# Patient Record
Sex: Female | Born: 1965
Health system: Southern US, Community
[De-identification: ages and names within clinical notes are randomized; demographics above are authoritative.]

## PROBLEM LIST (undated history)

## (undated) DIAGNOSIS — L121 Cicatricial pemphigoid: Secondary | ICD-10-CM

## (undated) DIAGNOSIS — K602 Anal fissure, unspecified: Secondary | ICD-10-CM

## (undated) DIAGNOSIS — K5790 Diverticulosis of intestine, part unspecified, without perforation or abscess without bleeding: Secondary | ICD-10-CM

## (undated) DIAGNOSIS — E785 Hyperlipidemia, unspecified: Secondary | ICD-10-CM

## (undated) DIAGNOSIS — R7989 Other specified abnormal findings of blood chemistry: Secondary | ICD-10-CM

## (undated) DIAGNOSIS — D126 Benign neoplasm of colon, unspecified: Secondary | ICD-10-CM

## (undated) DIAGNOSIS — M1990 Unspecified osteoarthritis, unspecified site: Secondary | ICD-10-CM

## (undated) DIAGNOSIS — I7300 Raynaud's syndrome without gangrene: Secondary | ICD-10-CM

## (undated) DIAGNOSIS — K648 Other hemorrhoids: Secondary | ICD-10-CM

## (undated) DIAGNOSIS — M810 Age-related osteoporosis without current pathological fracture: Secondary | ICD-10-CM

## (undated) DIAGNOSIS — I10 Essential (primary) hypertension: Secondary | ICD-10-CM

## (undated) HISTORY — DX: Hyperlipidemia, unspecified: E78.5

## (undated) HISTORY — DX: Essential (primary) hypertension: I10

## (undated) HISTORY — DX: Other specified abnormal findings of blood chemistry: R79.89

## (undated) HISTORY — DX: Other hemorrhoids: K64.8

## (undated) HISTORY — DX: Anal fissure, unspecified: K60.2

## (undated) HISTORY — PX: OTHER SURGICAL HISTORY: SHX169

## (undated) HISTORY — DX: Unspecified osteoarthritis, unspecified site: M19.90

## (undated) HISTORY — DX: Raynaud's syndrome without gangrene: I73.00

## (undated) HISTORY — DX: Age-related osteoporosis without current pathological fracture: M81.0

## (undated) HISTORY — DX: Cicatricial pemphigoid: L12.1

## (undated) HISTORY — PX: COLONOSCOPY: SHX174

## (undated) HISTORY — DX: Benign neoplasm of colon, unspecified: D12.6

## (undated) HISTORY — DX: Diverticulosis of intestine, part unspecified, without perforation or abscess without bleeding: K57.90

---

## 2016-10-13 ENCOUNTER — Ambulatory Visit
Admission: RE | Admit: 2016-10-13 | Discharge: 2016-10-13 | Disposition: A | Discharge status: Home / Self Care | Payer: BLUE CROSS/BLUE SHIELD | Source: Ambulatory Visit | Attending: Family Medicine | Admitting: Family Medicine

## 2016-10-13 ENCOUNTER — Other Ambulatory Visit: Payer: Self-pay | Admitting: Family Medicine

## 2016-10-13 DIAGNOSIS — M1712 Unilateral primary osteoarthritis, left knee: Secondary | ICD-10-CM

## 2017-06-13 DIAGNOSIS — R238 Other skin changes: Secondary | ICD-10-CM | POA: Diagnosis not present

## 2017-06-13 DIAGNOSIS — Z23 Encounter for immunization: Secondary | ICD-10-CM | POA: Diagnosis not present

## 2017-06-13 DIAGNOSIS — Z6822 Body mass index (BMI) 22.0-22.9, adult: Secondary | ICD-10-CM | POA: Diagnosis not present

## 2017-06-13 DIAGNOSIS — L299 Pruritus, unspecified: Secondary | ICD-10-CM | POA: Diagnosis not present

## 2017-07-06 DIAGNOSIS — Z1231 Encounter for screening mammogram for malignant neoplasm of breast: Secondary | ICD-10-CM | POA: Diagnosis not present

## 2017-08-10 DIAGNOSIS — R899 Unspecified abnormal finding in specimens from other organs, systems and tissues: Secondary | ICD-10-CM | POA: Diagnosis not present

## 2017-08-10 DIAGNOSIS — M19049 Primary osteoarthritis, unspecified hand: Secondary | ICD-10-CM | POA: Diagnosis not present

## 2017-08-10 DIAGNOSIS — M778 Other enthesopathies, not elsewhere classified: Secondary | ICD-10-CM | POA: Diagnosis not present

## 2017-08-10 DIAGNOSIS — Z6821 Body mass index (BMI) 21.0-21.9, adult: Secondary | ICD-10-CM | POA: Diagnosis not present

## 2018-04-02 DIAGNOSIS — Z13220 Encounter for screening for lipoid disorders: Secondary | ICD-10-CM | POA: Diagnosis not present

## 2018-04-02 DIAGNOSIS — Z1329 Encounter for screening for other suspected endocrine disorder: Secondary | ICD-10-CM | POA: Diagnosis not present

## 2018-04-02 DIAGNOSIS — Z0000 Encounter for general adult medical examination without abnormal findings: Secondary | ICD-10-CM | POA: Diagnosis not present

## 2018-04-02 DIAGNOSIS — E559 Vitamin D deficiency, unspecified: Secondary | ICD-10-CM | POA: Diagnosis not present

## 2018-04-02 DIAGNOSIS — Z114 Encounter for screening for human immunodeficiency virus [HIV]: Secondary | ICD-10-CM | POA: Diagnosis not present

## 2018-04-05 DIAGNOSIS — Z1211 Encounter for screening for malignant neoplasm of colon: Secondary | ICD-10-CM | POA: Diagnosis not present

## 2018-04-05 DIAGNOSIS — Z6821 Body mass index (BMI) 21.0-21.9, adult: Secondary | ICD-10-CM | POA: Diagnosis not present

## 2018-04-05 DIAGNOSIS — M2550 Pain in unspecified joint: Secondary | ICD-10-CM | POA: Diagnosis not present

## 2018-04-05 DIAGNOSIS — M1990 Unspecified osteoarthritis, unspecified site: Secondary | ICD-10-CM | POA: Diagnosis not present

## 2018-04-05 DIAGNOSIS — M8580 Other specified disorders of bone density and structure, unspecified site: Secondary | ICD-10-CM | POA: Diagnosis not present

## 2018-04-05 DIAGNOSIS — K602 Anal fissure, unspecified: Secondary | ICD-10-CM | POA: Diagnosis not present

## 2018-04-05 DIAGNOSIS — M1711 Unilateral primary osteoarthritis, right knee: Secondary | ICD-10-CM | POA: Diagnosis not present

## 2018-04-05 DIAGNOSIS — Z0000 Encounter for general adult medical examination without abnormal findings: Secondary | ICD-10-CM | POA: Diagnosis not present

## 2018-04-05 DIAGNOSIS — Z23 Encounter for immunization: Secondary | ICD-10-CM | POA: Diagnosis not present

## 2018-04-23 DIAGNOSIS — Z6821 Body mass index (BMI) 21.0-21.9, adult: Secondary | ICD-10-CM | POA: Diagnosis not present

## 2018-04-23 DIAGNOSIS — M2550 Pain in unspecified joint: Secondary | ICD-10-CM | POA: Diagnosis not present

## 2018-04-23 DIAGNOSIS — R768 Other specified abnormal immunological findings in serum: Secondary | ICD-10-CM | POA: Diagnosis not present

## 2018-04-23 DIAGNOSIS — K602 Anal fissure, unspecified: Secondary | ICD-10-CM | POA: Diagnosis not present

## 2018-05-15 DIAGNOSIS — R768 Other specified abnormal immunological findings in serum: Secondary | ICD-10-CM | POA: Diagnosis not present

## 2018-05-15 DIAGNOSIS — M2550 Pain in unspecified joint: Secondary | ICD-10-CM | POA: Diagnosis not present

## 2018-05-21 DIAGNOSIS — Z23 Encounter for immunization: Secondary | ICD-10-CM | POA: Diagnosis not present

## 2018-05-21 DIAGNOSIS — K649 Unspecified hemorrhoids: Secondary | ICD-10-CM | POA: Diagnosis not present

## 2018-05-21 DIAGNOSIS — K602 Anal fissure, unspecified: Secondary | ICD-10-CM | POA: Diagnosis not present

## 2018-05-21 DIAGNOSIS — K5900 Constipation, unspecified: Secondary | ICD-10-CM | POA: Diagnosis not present

## 2018-05-21 DIAGNOSIS — M2550 Pain in unspecified joint: Secondary | ICD-10-CM | POA: Diagnosis not present

## 2018-09-28 DIAGNOSIS — E559 Vitamin D deficiency, unspecified: Secondary | ICD-10-CM | POA: Diagnosis not present

## 2018-10-04 DIAGNOSIS — E559 Vitamin D deficiency, unspecified: Secondary | ICD-10-CM | POA: Diagnosis not present

## 2018-10-04 DIAGNOSIS — K5900 Constipation, unspecified: Secondary | ICD-10-CM | POA: Diagnosis not present

## 2018-10-04 DIAGNOSIS — K602 Anal fissure, unspecified: Secondary | ICD-10-CM | POA: Diagnosis not present

## 2018-10-04 DIAGNOSIS — Z6821 Body mass index (BMI) 21.0-21.9, adult: Secondary | ICD-10-CM | POA: Diagnosis not present

## 2018-12-29 IMAGING — CR DG KNEE 1-2V*R*
2 series · 2 of 2 positions shown · non-contrast
Comparison: None.

CLINICAL DATA: Chronic pain for approximately 3 months

EXAM:
RIGHT KNEE - 1-2 VIEW

[w knee ap right]
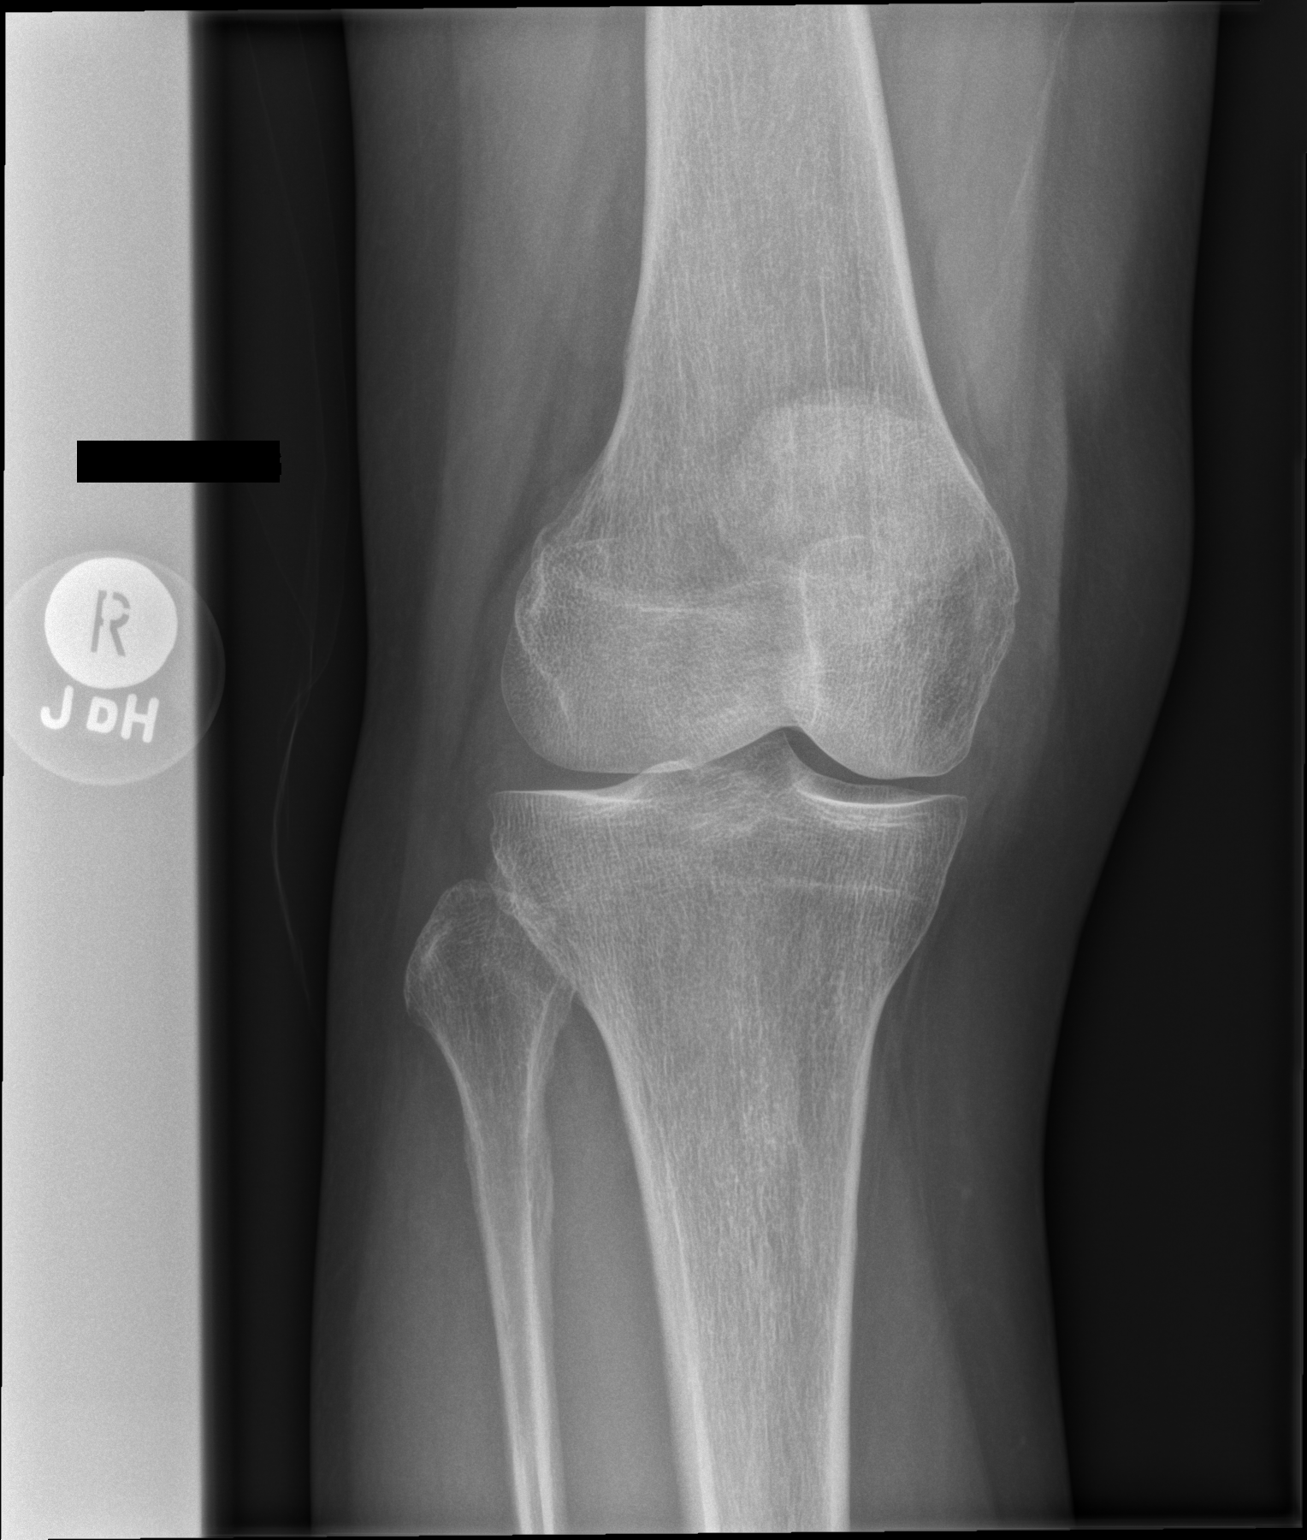

[w knee lat right]
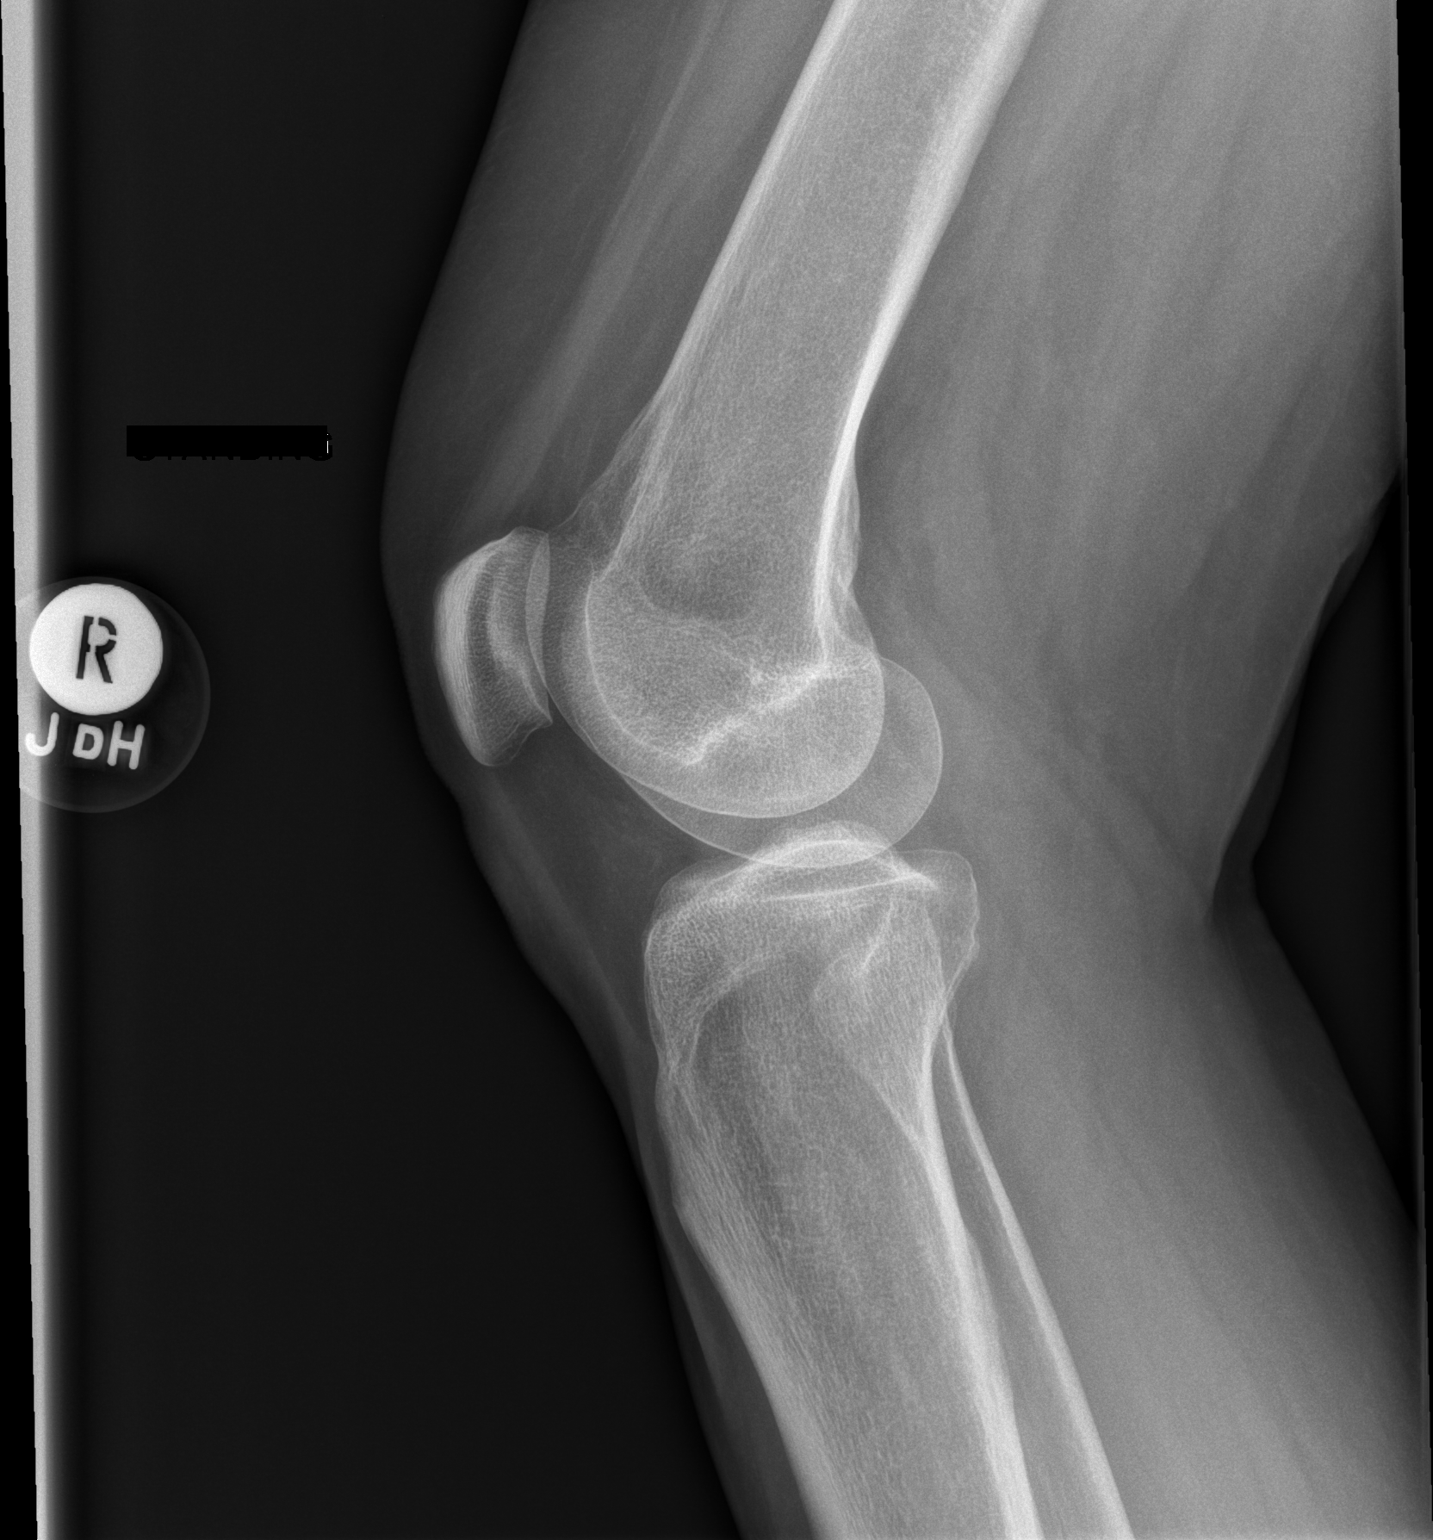

[2 of 2 positions shown; findings below may reference images not displayed]

FINDINGS: Standing frontal and standing lateral views obtained. No evident
fracture or dislocation. No joint effusion. Joint spaces appear
unremarkable. No erosive change.
IMPRESSION: No fracture or joint effusion.  No appreciable arthropathic change.

## 2019-06-13 DIAGNOSIS — Z23 Encounter for immunization: Secondary | ICD-10-CM | POA: Diagnosis not present

## 2019-10-24 DIAGNOSIS — Z1231 Encounter for screening mammogram for malignant neoplasm of breast: Secondary | ICD-10-CM | POA: Diagnosis not present

## 2019-10-24 DIAGNOSIS — Z1151 Encounter for screening for human papillomavirus (HPV): Secondary | ICD-10-CM | POA: Diagnosis not present

## 2019-10-24 DIAGNOSIS — Z01419 Encounter for gynecological examination (general) (routine) without abnormal findings: Secondary | ICD-10-CM | POA: Diagnosis not present

## 2019-10-24 DIAGNOSIS — Z6820 Body mass index (BMI) 20.0-20.9, adult: Secondary | ICD-10-CM | POA: Diagnosis not present

## 2019-10-29 ENCOUNTER — Other Ambulatory Visit: Payer: Self-pay | Admitting: Obstetrics and Gynecology

## 2019-10-29 DIAGNOSIS — Z780 Asymptomatic menopausal state: Secondary | ICD-10-CM

## 2019-10-29 DIAGNOSIS — Z8739 Personal history of other diseases of the musculoskeletal system and connective tissue: Secondary | ICD-10-CM

## 2019-11-05 ENCOUNTER — Other Ambulatory Visit: Payer: Self-pay

## 2019-11-05 ENCOUNTER — Ambulatory Visit
Admission: RE | Admit: 2019-11-05 | Discharge: 2019-11-05 | Disposition: A | Discharge status: Home / Self Care | Payer: BC Managed Care – PPO | Source: Ambulatory Visit | Attending: Obstetrics and Gynecology | Admitting: Obstetrics and Gynecology

## 2019-11-05 DIAGNOSIS — M810 Age-related osteoporosis without current pathological fracture: Secondary | ICD-10-CM | POA: Diagnosis not present

## 2019-11-05 DIAGNOSIS — Z780 Asymptomatic menopausal state: Secondary | ICD-10-CM

## 2019-11-05 DIAGNOSIS — M8588 Other specified disorders of bone density and structure, other site: Secondary | ICD-10-CM | POA: Diagnosis not present

## 2019-11-05 DIAGNOSIS — Z8739 Personal history of other diseases of the musculoskeletal system and connective tissue: Secondary | ICD-10-CM

## 2019-11-13 ENCOUNTER — Ambulatory Visit: Payer: BLUE CROSS/BLUE SHIELD | Admitting: Obstetrics and Gynecology

## 2019-11-15 DIAGNOSIS — M810 Age-related osteoporosis without current pathological fracture: Secondary | ICD-10-CM | POA: Diagnosis not present

## 2019-12-04 DIAGNOSIS — M810 Age-related osteoporosis without current pathological fracture: Secondary | ICD-10-CM | POA: Diagnosis not present

## 2019-12-04 DIAGNOSIS — E559 Vitamin D deficiency, unspecified: Secondary | ICD-10-CM | POA: Diagnosis not present

## 2019-12-04 DIAGNOSIS — H9319 Tinnitus, unspecified ear: Secondary | ICD-10-CM | POA: Diagnosis not present

## 2019-12-12 DIAGNOSIS — E7800 Pure hypercholesterolemia, unspecified: Secondary | ICD-10-CM | POA: Diagnosis not present

## 2019-12-12 DIAGNOSIS — E559 Vitamin D deficiency, unspecified: Secondary | ICD-10-CM | POA: Diagnosis not present

## 2019-12-12 DIAGNOSIS — Z0184 Encounter for antibody response examination: Secondary | ICD-10-CM | POA: Diagnosis not present

## 2019-12-12 DIAGNOSIS — Z0000 Encounter for general adult medical examination without abnormal findings: Secondary | ICD-10-CM | POA: Diagnosis not present

## 2019-12-19 DIAGNOSIS — Z0000 Encounter for general adult medical examination without abnormal findings: Secondary | ICD-10-CM | POA: Diagnosis not present

## 2019-12-19 DIAGNOSIS — H9319 Tinnitus, unspecified ear: Secondary | ICD-10-CM | POA: Diagnosis not present

## 2019-12-19 DIAGNOSIS — B354 Tinea corporis: Secondary | ICD-10-CM | POA: Diagnosis not present

## 2019-12-19 DIAGNOSIS — Z6820 Body mass index (BMI) 20.0-20.9, adult: Secondary | ICD-10-CM | POA: Diagnosis not present

## 2019-12-19 DIAGNOSIS — Z1211 Encounter for screening for malignant neoplasm of colon: Secondary | ICD-10-CM | POA: Diagnosis not present

## 2020-01-15 DIAGNOSIS — H903 Sensorineural hearing loss, bilateral: Secondary | ICD-10-CM | POA: Diagnosis not present

## 2020-02-03 ENCOUNTER — Ambulatory Visit (INDEPENDENT_AMBULATORY_CARE_PROVIDER_SITE_OTHER): Payer: BC Managed Care – PPO | Admitting: Otolaryngology

## 2020-02-03 ENCOUNTER — Encounter (INDEPENDENT_AMBULATORY_CARE_PROVIDER_SITE_OTHER): Payer: Self-pay | Admitting: Otolaryngology

## 2020-02-03 ENCOUNTER — Other Ambulatory Visit: Payer: Self-pay

## 2020-02-03 VITALS — Temp 98.2°F

## 2020-02-03 DIAGNOSIS — H9313 Tinnitus, bilateral: Secondary | ICD-10-CM | POA: Diagnosis not present

## 2020-02-03 DIAGNOSIS — H6121 Impacted cerumen, right ear: Secondary | ICD-10-CM | POA: Diagnosis not present

## 2020-02-03 DIAGNOSIS — K1230 Oral mucositis (ulcerative), unspecified: Secondary | ICD-10-CM

## 2020-02-03 NOTE — Progress Notes (Signed)
HPI: Michelle Cunningham is a 54 y.o. female who presents is referred by her periodontist for evaluation of redness on her alveolar mucosa on the inner side of the upper molars in the front portion of the incisors up top.  She had a biopsy performed that demonstrated only ulcerative mucositis and no evidence of malignancy.  She has had this problem for several months and they felt it might be lichen planus.  She was referred to be seen at [facility] dentistry for further evaluation and possible rebiopsy. She also has ringing in her ears and brings a hearing test from [facility] which demonstrated a mild bilateral symmetric upper frequency sensorineural hearing loss above 4000 frequency.  She had type A tympanograms bilaterally.  No past medical history on file.  Social History   Socioeconomic History  . Marital status: Married    Spouse name: Not on file  . Number of children: Not on file  . Years of education: Not on file  . Highest education level: Not on file  Occupational History  . Not on file  Tobacco Use  . Smoking status: Never Smoker  . Smokeless tobacco: Never Used  Substance and Sexual Activity  . Alcohol use: Not on file  . Drug use: Not on file  . Sexual activity: Not on file  Other Topics Concern  . Not on file  Social History Narrative  . Not on file   Social Determinants of Health   Financial Resource Strain:   . Difficulty of Paying Living Expenses:   Food Insecurity:   . Worried About Charity fundraiser in the Last Year:   . Arboriculturist in the Last Year:   Transportation Needs:   . Film/video editor (Medical):   Marland Kitchen Lack of Transportation (Non-Medical):   Physical Activity:   . Days of Exercise per Week:   . Minutes of Exercise per Session:   Stress:   . Feeling of Stress :   Social Connections:   . Frequency of Communication with Friends and Family:   . Frequency of Social Gatherings with Friends and Family:   . Attends Religious Services:    . Active Member of Clubs or Organizations:   . Attends Archivist Meetings:   Marland Kitchen Marital Status:    No family history on file. No Known Allergies Prior to Admission medications   Not on File     Positive ROS: Otherwise negative  All other systems have been reviewed and were otherwise negative with the exception of those mentioned in the HPI and as above.  Physical Exam: Constitutional: Alert, well-appearing, no acute distress Ears: External ears without lesions or tenderness.  There was some wax obstructing the right TM that was removed in the office using forceps.  The TM was otherwise clear. Nasal: External nose without lesions.. Clear nasal passages Oral: Lips and gums without lesions. Tongue and palate mucosa without lesions. Posterior oropharynx clear.  She has some mild erythema of the medial alveolar mucosa around teeth #3 4 and 5 as well as externally to teeth 8 & 9.  This appears to be inflammatory and erythematous and not malignant in appearance. Neck: No palpable adenopathy or masses Respiratory: Breathing comfortably  Skin: No facial/neck lesions or rash noted.  Cerumen impaction removal  Date/Time: 02/03/2020 7:04 PM Performed by: Rozetta Nunnery, MD Authorized by: Rozetta Nunnery, MD   Consent:    Consent obtained:  Verbal   Consent given by:  Patient   Risks discussed:  Pain and bleeding Procedure details:    Location:  R ear   Procedure type: forceps   Post-procedure details:    Inspection:  TM intact and canal normal   Hearing quality:  Improved   Patient tolerance of procedure:  Tolerated well, no immediate complications Comments:     TMs are clear bilaterally.    Assessment: Oral mucositis questionable etiology. Tinnitus secondary to presbycusis.  Plan: Discussed with her concerning use of Zilactin to help with the pain or discomfort associated with the oral mucositis.  I agree with plan to be further evaluated at  [facility]  dentistry.  Could also try Lactinex 2 to 3 capsules 3 times daily as this helps in some inflammatory changes of the mouth. Concerning the tinnitus reviewed with her concerning limited treatment for this and to use masking noise to help with control of this.   [name], MD   CC:

## 2020-02-13 ENCOUNTER — Encounter (INDEPENDENT_AMBULATORY_CARE_PROVIDER_SITE_OTHER): Payer: Self-pay

## 2020-02-13 ENCOUNTER — Other Ambulatory Visit: Payer: Self-pay | Admitting: Family Medicine

## 2020-02-13 DIAGNOSIS — I10 Essential (primary) hypertension: Secondary | ICD-10-CM | POA: Diagnosis not present

## 2020-02-13 DIAGNOSIS — R634 Abnormal weight loss: Secondary | ICD-10-CM | POA: Diagnosis not present

## 2020-02-13 DIAGNOSIS — K6289 Other specified diseases of anus and rectum: Secondary | ICD-10-CM | POA: Diagnosis not present

## 2020-02-13 DIAGNOSIS — H9319 Tinnitus, unspecified ear: Secondary | ICD-10-CM | POA: Diagnosis not present

## 2020-02-13 DIAGNOSIS — R109 Unspecified abdominal pain: Secondary | ICD-10-CM

## 2020-02-17 ENCOUNTER — Encounter: Payer: Self-pay | Admitting: Gastroenterology

## 2020-02-20 ENCOUNTER — Ambulatory Visit
Admission: RE | Admit: 2020-02-20 | Discharge: 2020-02-20 | Disposition: A | Discharge status: Home / Self Care | Payer: BC Managed Care – PPO | Source: Ambulatory Visit | Attending: Family Medicine | Admitting: Family Medicine

## 2020-02-20 ENCOUNTER — Other Ambulatory Visit: Payer: Self-pay

## 2020-02-20 DIAGNOSIS — R109 Unspecified abdominal pain: Secondary | ICD-10-CM

## 2020-02-26 DIAGNOSIS — K602 Anal fissure, unspecified: Secondary | ICD-10-CM | POA: Diagnosis not present

## 2020-02-27 ENCOUNTER — Other Ambulatory Visit (HOSPITAL_COMMUNITY): Payer: Self-pay | Admitting: Radiology

## 2020-03-25 DIAGNOSIS — K1370 Unspecified lesions of oral mucosa: Secondary | ICD-10-CM | POA: Diagnosis not present

## 2020-03-25 DIAGNOSIS — K602 Anal fissure, unspecified: Secondary | ICD-10-CM | POA: Diagnosis not present

## 2020-03-25 DIAGNOSIS — K649 Unspecified hemorrhoids: Secondary | ICD-10-CM | POA: Diagnosis not present

## 2020-03-25 DIAGNOSIS — R634 Abnormal weight loss: Secondary | ICD-10-CM | POA: Diagnosis not present

## 2020-04-08 ENCOUNTER — Ambulatory Visit (INDEPENDENT_AMBULATORY_CARE_PROVIDER_SITE_OTHER): Payer: BC Managed Care – PPO | Admitting: Gastroenterology

## 2020-04-08 ENCOUNTER — Encounter: Payer: Self-pay | Admitting: Gastroenterology

## 2020-04-08 ENCOUNTER — Other Ambulatory Visit (INDEPENDENT_AMBULATORY_CARE_PROVIDER_SITE_OTHER): Payer: BC Managed Care – PPO

## 2020-04-08 VITALS — BP 128/68 | HR 72 | Ht 66.5 in | Wt 126.1 lb

## 2020-04-08 DIAGNOSIS — R195 Other fecal abnormalities: Secondary | ICD-10-CM

## 2020-04-08 DIAGNOSIS — R634 Abnormal weight loss: Secondary | ICD-10-CM

## 2020-04-08 LAB — IGA: IgA: 271 mg/dL (ref 68–378)

## 2020-04-08 MED ORDER — SUTAB 1479-225-188 MG PO TABS: ORAL_TABLET | ORAL | 0 refills | Status: DC

## 2020-04-08 NOTE — Progress Notes (Signed)
Michelle Cunningham    [id number]    11-17-1965  Primary Care Physician:Dewey, Mechele Claude, MD  Referring Physician: Fanny Bien, MD [address] [place],  [place] [address]   Chief complaint: Weight loss, heme-positive stool HPI:  54 year old very pleasant female here for new patient visit with complaints of unintentional weight loss, approximately 10 pounds in the past 6 to 10 months She has not changed her eating habits, feels she is eating more and her appetite is normal but she is continuing to lose weight Denies any abdominal pain, nausea, vomiting, change in bowel habits, melena or blood per rectum Fecal Hemoccult was positive  She has anal itching and discomfort..  She has an aphthous ulcer on her gumline and also soft palate.  No family history of celiac disease.  Her brother has ulcerative colitis Her daughter has juvenile idiopathic arthritis  Outpatient Encounter Medications as of 04/08/2020  Medication Sig  . alendronate (FOSAMAX) 70 MG tablet Take 70 mg by mouth once a week.  . Cholecalciferol (VITAMIN D-3) 125 MCG (5000 UT) TABS Take 1 tablet by mouth daily.  . valsartan (DIOVAN) 40 MG tablet Take 40 mg by mouth daily. (Patient not taking: Reported on 04/08/2020)   No facility-administered encounter medications on file as of 04/08/2020.    Allergies as of 04/08/2020  . (No Known Allergies)    Past Medical History:  Diagnosis Date  . Anal fissure   . HLD (hyperlipidemia)   . HTN (hypertension)   . Osteoarthritis   . Osteoporosis     Past Surgical History:  Procedure Laterality Date  . TRANSVAGINAL TAPE (TVT) REMOVAL      Family History  Problem Relation Age of Onset  . Ulcerative colitis Brother   . Osteoporosis Paternal Grandfather   . Diabetes Paternal Grandfather   . Juvenile idiopathic arthritis Daughter     Social History   Socioeconomic History  . Marital status: Married    Spouse name: Not on file  . Number of  children: 1  . Years of education: Not on file  . Highest education level: Not on file  Occupational History  . Not on file  Tobacco Use  . Smoking status: Never Smoker  . Smokeless tobacco: Never Used  Vaping Use  . Vaping Use: Never used  Substance and Sexual Activity  . Alcohol use: Yes    Comment: 1-2 daily  . Drug use: Never  . Sexual activity: Not on file  Other Topics Concern  . Not on file  Social History Narrative  . Not on file   Social Determinants of Health   Financial Resource Strain:   . Difficulty of Paying Living Expenses:   Food Insecurity:   . Worried About Charity fundraiser in the Last Year:   . Arboriculturist in the Last Year:   Transportation Needs:   . Film/video editor (Medical):   Marland Kitchen Lack of Transportation (Non-Medical):   Physical Activity:   . Days of Exercise per Week:   . Minutes of Exercise per Session:   Stress:   . Feeling of Stress :   Social Connections:   . Frequency of Communication with Friends and Family:   . Frequency of Social Gatherings with Friends and Family:   . Attends Religious Services:   . Active Member of Clubs or Organizations:   . Attends Archivist Meetings:   Marland Kitchen Marital  Status:   Intimate Partner Violence:   . Fear of Current or Ex-Partner:   . Emotionally Abused:   Marland Kitchen Physically Abused:   . Sexually Abused:       Review of systems: All other review of systems negative except as mentioned in the HPI.   Physical Exam: Vitals:   04/08/20 0840  BP: 128/68  Pulse: 72   Body mass index is 20.05 kg/m. Gen:      No acute distress HEENT:  sclera anicteric Abd:      soft, non-tender; no palpable masses, no distension Ext:    No edema Neuro: alert and oriented x 3 Psych: normal mood and affect  Data Reviewed:  Reviewed labs, radiology imaging, old records and pertinent past GI work up   Assessment and Plan/Recommendations:  54 year old very pleasant female here with complaints of  unintentional weight loss and fecal Hemoccult positive Family history positive for autoimmune disease, brother has ulcerative colitis  Check TTG IgA antibody to exclude celiac disease Schedule for EGD and colonoscopy for further evaluation, will need to exclude IBD or neoplastic lesion The risks and benefits as well as alternatives of endoscopic procedure(s) have been discussed and reviewed. All questions answered. The patient agrees to proceed.  Return in 3 months or sooner if needed  The patient was provided an opportunity to ask questions and all were answered. The patient agreed with the plan and demonstrated an understanding of the instructions.  Damaris Hippo , MD    CC: Fanny Bien, MD

## 2020-04-08 NOTE — Patient Instructions (Signed)
You have been scheduled for an endoscopy and colonoscopy. Please follow the written instructions given to you at your visit today. Please pick up your prep supplies at the pharmacy within the next 1-3 days. If you use inhalers (even only as needed), please bring them with you on the day of your procedure.   If you are age 56 or older, your body mass index should be between 23-30. Your Body mass index is 20.05 kg/m. If this is out of the aforementioned range listed, please consider follow up with your Primary Care Provider.  If you are age 19 or younger, your body mass index should be between 19-25. Your Body mass index is 20.05 kg/m. If this is out of the aformentioned range listed, please consider follow up with your Primary Care Provider.    Due to recent changes in healthcare laws, you may see the results of your imaging and laboratory studies on MyChart before your provider has had a chance to review them.  We understand that in some cases there may be results that are confusing or concerning to you. Not all laboratory results come back in the same time frame and the provider may be waiting for multiple results in order to interpret others.  Please give [place] 48 hours in order for your provider to thoroughly review all the results before contacting the office for clarification of your results.   Your provider has requested that you go to the basement level for lab work before leaving today. Press "B" on the elevator. The lab is located at the first door on the left as you exit the elevator.  Follow up in 3 months   I appreciate the  opportunity to care for you  Thank You   Harl Bowie , MD

## 2020-04-09 ENCOUNTER — Encounter: Payer: BC Managed Care – PPO | Admitting: Gastroenterology

## 2020-04-10 LAB — TISSUE TRANSGLUTAMINASE ABS,IGG,IGA
(tTG) Ab, IgA: 1 U/mL
(tTG) Ab, IgG: 2 U/mL

## 2020-04-14 ENCOUNTER — Encounter: Payer: Self-pay | Admitting: Gastroenterology

## 2020-06-05 ENCOUNTER — Encounter: Payer: Self-pay | Admitting: Gastroenterology

## 2020-06-05 ENCOUNTER — Other Ambulatory Visit: Payer: Self-pay

## 2020-06-05 ENCOUNTER — Ambulatory Visit (AMBULATORY_SURGERY_CENTER): Payer: BC Managed Care – PPO | Admitting: Gastroenterology

## 2020-06-05 VITALS — BP 135/57 | HR 54 | Temp 97.8°F | Resp 17 | Ht 66.5 in | Wt 126.0 lb

## 2020-06-05 DIAGNOSIS — K3189 Other diseases of stomach and duodenum: Secondary | ICD-10-CM | POA: Diagnosis not present

## 2020-06-05 DIAGNOSIS — R634 Abnormal weight loss: Secondary | ICD-10-CM

## 2020-06-05 DIAGNOSIS — K2970 Gastritis, unspecified, without bleeding: Secondary | ICD-10-CM | POA: Diagnosis not present

## 2020-06-05 DIAGNOSIS — K635 Polyp of colon: Secondary | ICD-10-CM

## 2020-06-05 DIAGNOSIS — K648 Other hemorrhoids: Secondary | ICD-10-CM | POA: Diagnosis not present

## 2020-06-05 DIAGNOSIS — R159 Full incontinence of feces: Secondary | ICD-10-CM | POA: Diagnosis not present

## 2020-06-05 DIAGNOSIS — K5730 Diverticulosis of large intestine without perforation or abscess without bleeding: Secondary | ICD-10-CM

## 2020-06-05 DIAGNOSIS — R195 Other fecal abnormalities: Secondary | ICD-10-CM | POA: Diagnosis not present

## 2020-06-05 DIAGNOSIS — D123 Benign neoplasm of transverse colon: Secondary | ICD-10-CM

## 2020-06-05 DIAGNOSIS — D125 Benign neoplasm of sigmoid colon: Secondary | ICD-10-CM | POA: Diagnosis not present

## 2020-06-05 DIAGNOSIS — K2990 Gastroduodenitis, unspecified, without bleeding: Secondary | ICD-10-CM

## 2020-06-05 MED ORDER — OMEPRAZOLE 40 MG PO CPDR: 40.0000 mg | DELAYED_RELEASE_CAPSULE | Freq: Every day | ORAL | 0 refills | Status: DC

## 2020-06-05 MED ORDER — SODIUM CHLORIDE 0.9 % IV SOLN: 500.0000 mL | Freq: Once | INTRAVENOUS | Status: DC

## 2020-06-05 NOTE — Op Note (Signed)
[facility] Patient Name: Michelle Cunningham Procedure Date: 06/05/2020 9:24 AM MRN: [id number] Endoscopist: Mauri Pole , MD Age: 54 Referring MD:  Date of Birth: 1966/02/02 Gender: Female Account #: [id number] Procedure:                Colonoscopy Indications:              Evaluation of unexplained GI bleeding presenting                            with fecal occult blood Medicines:                Monitored Anesthesia Care Procedure:                Pre-Anesthesia Assessment:                           - Prior to the procedure, a History and Physical                            was performed, and patient medications and                            allergies were reviewed. The patient's tolerance of                            previous anesthesia was also reviewed. The risks                            and benefits of the procedure and the sedation                            options and risks were discussed with the patient.                            All questions were answered, and informed consent                            was obtained. Prior Anticoagulants: The patient has                            taken no previous anticoagulant or antiplatelet                            agents. ASA Grade Assessment: II - A patient with                            mild systemic disease. After reviewing the risks                            and benefits, the patient was deemed in                            satisfactory condition to undergo the procedure.  After obtaining informed consent, the colonoscope                            was passed under direct vision. Throughout the                            procedure, the patient's blood pressure, pulse, and                            oxygen saturations were monitored continuously. The                            Colonoscope was introduced through the anus and                            advanced to the the terminal ileum,  with                            identification of the appendiceal orifice and IC                            valve. The colonoscopy was performed without                            difficulty. The patient tolerated the procedure                            well. The quality of the bowel preparation was                            excellent. The ileocecal valve, appendiceal                            orifice, and rectum were photographed. Scope In: 1:94:17 AM Scope Out: 9:57:07 AM Scope Withdrawal Time: 0 hours 13 minutes 45 seconds  Total Procedure Duration: 0 hours 20 minutes 48 seconds  Findings:                 The perianal and digital rectal examinations were                            normal.                           Three sessile polyps were found in the transverse                            colon. The polyps were 4 to 11 mm in size. These                            polyps were removed with a cold snare. Resection                            and retrieval were complete.  A 10 mm polyp was found in the sigmoid colon. The                            polyp was pedunculated. The polyp was removed with                            a hot snare. Resection and retrieval were complete.                           Scattered small and large-mouthed diverticula were                            found in the sigmoid colon, descending colon,                            transverse colon and ascending colon.                           Non-bleeding internal hemorrhoids were found during                            retroflexion. The hemorrhoids were small.                           The exam was otherwise without abnormality. Complications:            No immediate complications. Estimated Blood Loss:     Estimated blood loss was minimal. Impression:               - Three 4 to 11 mm polyps in the transverse colon,                            removed with a cold snare. Resected and  retrieved.                           - One 10 mm polyp in the sigmoid colon, removed                            with a hot snare. Resected and retrieved.                           - Diverticulosis in the sigmoid colon, in the                            descending colon, in the transverse colon and in                            the ascending colon.                           - Non-bleeding internal hemorrhoids.                           - The examination was otherwise normal. Recommendation:           -  Patient has a contact number available for                            emergencies. The signs and symptoms of potential                            delayed complications were discussed with the                            patient. Return to normal activities tomorrow.                            Written discharge instructions were provided to the                            patient.                           - Resume previous diet.                           - Continue present medications.                           - Await pathology results.                           - Repeat colonoscopy in 3 - 5 years for                            surveillance based on pathology results.                           - See the other procedure note for documentation of                            additional recommendations. Mauri Pole, MD 06/05/2020 10:10:51 AM This report has been signed electronically.

## 2020-06-05 NOTE — Progress Notes (Signed)
Medical History reviewed with patient. No changes noted. VS WNL.

## 2020-06-05 NOTE — Op Note (Signed)
[place] [facility] Patient Name: Michelle Cunningham Procedure Date: 06/05/2020 9:24 AM MRN: [id number] Endoscopist: Mauri Pole , MD Age: 54 Referring MD:  Date of Birth: Jan 08, 1966 Gender: Female Account #: [id number] Procedure:                Upper GI endoscopy Indications:              Weight loss, Epigastric abdominal pain, Heme                            positive stool Medicines:                Monitored Anesthesia Care Procedure:                Pre-Anesthesia Assessment:                           - Prior to the procedure, a History and Physical                            was performed, and patient medications and                            allergies were reviewed. The patient's tolerance of                            previous anesthesia was also reviewed. The risks                            and benefits of the procedure and the sedation                            options and risks were discussed with the patient.                            All questions were answered, and informed consent                            was obtained. Prior Anticoagulants: The patient has                            taken no previous anticoagulant or antiplatelet                            agents. ASA Grade Assessment: II - A patient with                            mild systemic disease. After reviewing the risks                            and benefits, the patient was deemed in                            satisfactory condition to undergo the procedure.  After obtaining informed consent, the endoscope was                            passed under direct vision. Throughout the                            procedure, the patient's blood pressure, pulse, and                            oxygen saturations were monitored continuously. The                            Endoscope was introduced through the mouth, and                            advanced to the second part of duodenum. The  upper                            GI endoscopy was accomplished without difficulty.                            The patient tolerated the procedure well. Scope In: Scope Out: Findings:                 The Z-line was regular and was found 36 cm from the                            incisors.                           Patchy mild inflammation with hemorrhage                            characterized by adherent blood, congestion (edema)                            and erythema was found in the entire examined                            stomach. Biopsies were taken with a cold forceps                            for Helicobacter pylori testing.                           Patchy mildly erythematous mucosa without active                            bleeding was found in the first portion of the                            duodenum and in the second portion of the duodenum.  Biopsies were taken with a cold forceps for                            histology. Complications:            No immediate complications. Estimated Blood Loss:     Estimated blood loss was minimal. Impression:               - Z-line regular, 36 cm from the incisors.                           - Gastritis with hemorrhage. Biopsied.                           - Erythematous duodenopathy. Biopsied. Recommendation:           - Resume previous diet.                           - Continue present medications.                           - Await pathology results.                           - Use Prilosec (omeprazole) 40 mg PO daily for 3                            months.                           - Return to GI office at the next available                            appointment in 2-3 months. Mauri Pole, MD 06/05/2020 10:07:35 AM This report has been signed electronically.

## 2020-06-05 NOTE — Progress Notes (Deleted)
PT taken to PACU. Monitors in place. VSS. Report given to RN. 

## 2020-06-05 NOTE — Patient Instructions (Signed)
Take your new medication as ordered. Read all the handouts given to you by your recovery room nurse.  You will need another colonoscopy in 3-5 years. Thank-you for choosing [place] for your healthcare needs today.  YOU HAD AN ENDOSCOPIC PROCEDURE TODAY AT [facility]:   Refer to the procedure report that was given to you for any specific questions about what was found during the examination.  If the procedure report does not answer your questions, please call your gastroenterologist to clarify.  If you requested that your care partner not be given the details of your procedure findings, then the procedure report has been included in a sealed envelope for you to review at your convenience later.  YOU SHOULD EXPECT: Some feelings of bloating in the abdomen. Passage of more gas than usual.  Walking can help get rid of the air that was put into your GI tract during the procedure and reduce the bloating. If you had a lower endoscopy (such as a colonoscopy or flexible sigmoidoscopy) you may notice spotting of blood in your stool or on the toilet paper. If you underwent a bowel prep for your procedure, you may not have a normal bowel movement for a few days.  Please Note:  You might notice some irritation and congestion in your nose or some drainage.  This is from the oxygen used during your procedure.  There is no need for concern and it should clear up in a day or so.  SYMPTOMS TO REPORT IMMEDIATELY:   Following lower endoscopy (colonoscopy or flexible sigmoidoscopy):  Excessive amounts of blood in the stool  Significant tenderness or worsening of abdominal pains  Swelling of the abdomen that is new, acute  Fever of 100F or higher   Following upper endoscopy (EGD)  Vomiting of blood or coffee ground material  New chest pain or pain under the shoulder blades  Painful or persistently difficult swallowing  New shortness of breath  Fever of 100F or higher  Black, tarry-looking  stools  For urgent or emergent issues, a gastroenterologist can be reached at any hour by calling [phone]. Do not use MyChart messaging for urgent concerns.    DIET:  We do recommend a small meal at first, but then you may proceed to your regular diet.  Drink plenty of fluids but you should avoid alcoholic beverages for 24 hours.  ACTIVITY:  You should plan to take it easy for the rest of today and you should NOT DRIVE or use heavy machinery until tomorrow (because of the sedation medicines used during the test).    FOLLOW UP: Our staff will call the number listed on your records 48-72 hours following your procedure to check on you and address any questions or concerns that you may have regarding the information given to you following your procedure. If we do not reach you, we will leave a message.  We will attempt to reach you two times.  During this call, we will ask if you have developed any symptoms of COVID 19. If you develop any symptoms (ie: fever, flu-like symptoms, shortness of breath, cough etc.) before then, please call [phone].  If you test positive for Covid 19 in the 2 weeks post procedure, please call and report this information to [place].    If any biopsies were taken you will be contacted by phone or by letter within the next 1-3 weeks.  Please call us at [phone] if you have not heard about the biopsies  in 3 weeks.    SIGNATURES/CONFIDENTIALITY: You and/or your care partner have signed paperwork which will be entered into your electronic medical record.  These signatures attest to the fact that that the information above on your After Visit Summary has been reviewed and is understood.  Full responsibility of the confidentiality of this discharge information lies with you and/or your care-partner.

## 2020-06-05 NOTE — Progress Notes (Signed)
Called to room to assist during endoscopic procedure.  Patient ID and intended procedure confirmed with present staff. Received instructions for my participation in the procedure from the performing physician.  

## 2020-06-09 ENCOUNTER — Telehealth: Payer: Self-pay | Admitting: *Deleted

## 2020-06-09 NOTE — Telephone Encounter (Signed)
  Follow up Call-  Call back number 06/05/2020  Post procedure Call Back phone  # [phone]  Permission to leave phone message Yes  Some recent data might be hidden     Patient questions:  Do you have a fever, pain , or abdominal swelling? No. Pain Score  0 *  Have you tolerated food without any problems? Yes.    Have you been able to return to your normal activities? Yes.    Do you have any questions about your discharge instructions: Diet   No. Medications  No. Follow up visit  No.  Do you have questions or concerns about your Care? No.  Actions: * If pain score is 4 or above: No action needed, pain <4.  1. Have you developed a fever since your procedure?  no  2.   Have you had an respiratory symptoms (SOB or cough) since your procedure? no  3.   Have you tested positive for COVID 19 since your procedure no  4.   Have you had any family members/close contacts diagnosed with the COVID 19 since your procedure?  no   If yes to any of these questions please route to Joylene John, RN and Joella Prince, RN

## 2020-06-16 ENCOUNTER — Encounter: Payer: Self-pay | Admitting: Gastroenterology

## 2020-08-19 ENCOUNTER — Encounter: Payer: Self-pay | Admitting: Gastroenterology

## 2020-08-19 ENCOUNTER — Ambulatory Visit (INDEPENDENT_AMBULATORY_CARE_PROVIDER_SITE_OTHER): Payer: BC Managed Care – PPO | Admitting: Gastroenterology

## 2020-08-19 VITALS — BP 110/68 | HR 74 | Ht 66.5 in | Wt 127.2 lb

## 2020-08-19 DIAGNOSIS — K649 Unspecified hemorrhoids: Secondary | ICD-10-CM

## 2020-08-19 DIAGNOSIS — M1990 Unspecified osteoarthritis, unspecified site: Secondary | ICD-10-CM

## 2020-08-19 DIAGNOSIS — L290 Pruritus ani: Secondary | ICD-10-CM

## 2020-08-19 DIAGNOSIS — K120 Recurrent oral aphthae: Secondary | ICD-10-CM | POA: Diagnosis not present

## 2020-08-19 DIAGNOSIS — R634 Abnormal weight loss: Secondary | ICD-10-CM

## 2020-08-19 DIAGNOSIS — R21 Rash and other nonspecific skin eruption: Secondary | ICD-10-CM | POA: Diagnosis not present

## 2020-08-19 DIAGNOSIS — K625 Hemorrhage of anus and rectum: Secondary | ICD-10-CM

## 2020-08-19 NOTE — Progress Notes (Signed)
Michelle Cunningham    [id number]    02-Jan-1966  Primary Care Physician:Dewey, Mechele Claude, MD  Referring Physician: Fanny Bien, MD [place],  [place] [id number]   Chief complaint: Weight loss HPI:  54 year old very pleasant female here for follow-up visit for weight loss, and dyspepsia  She is no longer losing weight but has been unable to gain the weight back even though she is eating well and has normal appetite.  She continues to have oral aphthous ulcers intermittently.  Also has skin rash, myalgia and joint pain. Her brother has ulcerative colitis and her daughter has juvenile idiopathic arthritis  EGD and colonoscopy were negative for celiac disease or ulcerative colitis.  Cannot exclude Crohn's disease but did not have any overt GI lesions in the visualized mucosa in upper gastrointestinal tract and colon  Denies any nausea, vomiting, abdominal pain, melena or bright red blood per rectum   Colonoscopy June 05, 2020: 4 polyps [sessile serrated polyps and tubular adenoma] removed from colon ranging in size 4 to 11 mm.  Diverticulosis and hemorrhoids  EGD June 05, 2020: Mild gastritis, biopsies negative for H. pylori, dysplasia or intestinal metaplasia.  Duodenal biopsies negative for celiac disease  Outpatient Encounter Medications as of 08/19/2020  Medication Sig  . alendronate (FOSAMAX) 70 MG tablet Take 70 mg by mouth once a week.  . Cholecalciferol (VITAMIN D-3) 125 MCG (5000 UT) TABS Take 1 tablet by mouth daily.  Marland Kitchen omeprazole (PRILOSEC) 40 MG capsule Take 1 capsule (40 mg total) by mouth daily.  . Sodium Sulfate-Mag Sulfate-KCl (SUTAB) [phone] MG TABS 1 prep kit    BIN: [id number] PCN: CN GROUP: [id number] MEMBER ID: [id number];CLE AS CASH;NO PRIOR AUTHORIZATION (Patient not taking: Reported on 06/05/2020)  . valsartan (DIOVAN) 40 MG tablet Take 40 mg by mouth daily. (Patient not taking: Reported on 04/08/2020)   No  facility-administered encounter medications on file as of 08/19/2020.    Allergies as of 08/19/2020  . (No Known Allergies)    Past Medical History:  Diagnosis Date  . Anal fissure   . HLD (hyperlipidemia)   . HTN (hypertension)   . Osteoarthritis   . Osteoporosis     Past Surgical History:  Procedure Laterality Date  . TRANSVAGINAL TAPE (TVT) REMOVAL     Not removed    Family History  Problem Relation Age of Onset  . Ulcerative colitis Brother   . Osteoporosis Paternal Grandmother   . Diabetes Paternal Grandfather   . Juvenile idiopathic arthritis Daughter     Social History   Socioeconomic History  . Marital status: Married    Spouse name: Not on file  . Number of children: 1  . Years of education: Not on file  . Highest education level: Not on file  Occupational History  . Not on file  Tobacco Use  . Smoking status: Never Smoker  . Smokeless tobacco: Never Used  Vaping Use  . Vaping Use: Never used  Substance and Sexual Activity  . Alcohol use: Yes    Comment: 1-2 daily  . Drug use: Never  . Sexual activity: Not on file  Other Topics Concern  . Not on file  Social History Narrative  . Not on file   Social Determinants of Health   Financial Resource Strain: Not on file  Food Insecurity: Not on file  Transportation Needs: Not on file  Physical Activity: Not on file  Stress: Not on file  Social Connections: Not on file  Intimate Partner Violence: Not on file      Review of systems: All other review of systems negative except as mentioned in the HPI.   Physical Exam: There were no vitals filed for this visit. There is no height or weight on file to calculate BMI. Gen:      No acute distress HEENT:  sclera anicteric Abd:      soft, non-tender; no palpable masses, no distension Ext:    No edema Neuro: alert and oriented x 3 Psych: normal mood and affect  Data Reviewed:  Reviewed labs, radiology imaging, old records and pertinent past GI  work up   Assessment and Plan/Recommendations:  54 year old very pleasant female here for follow-up visit for unintentional weight loss GI work-up is negative for celiac disease or ulcerative colitis.  No overt lesions to diagnose Crohn's disease based on work-up so far.  We will hold off checking serology for Crohn's disease as the sensitivity and specificity for it is low.  She has family history of autoimmune disease, ulcerative colitis in her brother juvenile arthritis in her daughter  Complains of aphthous ulcers, myalgia and arthralgia, will refer to rheumatology for further evaluation  Intermittent small-volume bright red blood per rectum secondary to symptomatic hemorrhoids We will schedule for hemorrhoidal band ligation, next available appointment Avoid excessive straining during defecation  Erosive gastritis: Continue omeprazole daily for 3 months, following that taper dose and use as needed  Return in 3 months for follow-up office visit or sooner if needed  This visit required 45 minutes of patient care (this includes precharting, chart review, review of results, face-to-face time used for counseling as well as treatment plan and follow-up. The patient was provided an opportunity to ask questions and all were answered. The patient agreed with the plan and demonstrated an understanding of the instructions.  Damaris Hippo , MD    CC: Fanny Bien, MD

## 2020-08-19 NOTE — Patient Instructions (Addendum)
You have been scheduled with Dr.Nandigam for a hemorrhoidal banding on 09/02/20 at 3:50 pm.  Please call our office to schedule a follow up with Dr Silverio Decamp for late March 2022.  Take your omeprazole for 3 months. After your have completed this, you may wean yourself completely off of this.  We have placed a referral for you to see Dr Estanislado Pandy, rheumatologist. If you have not heard from her office within the next 2 weeks, please call our office at [phone].  If you are age 56 or older, your body mass index should be between 23-30. Your Body mass index is 20.22 kg/m. If this is out of the aforementioned range listed, please consider follow up with your Primary Care Provider.  If you are age 22 or younger, your body mass index should be between 19-25. Your Body mass index is 20.22 kg/m. If this is out of the aformentioned range listed, please consider follow up with your Primary Care Provider.   Due to recent changes in healthcare laws, you may see the results of your imaging and laboratory studies on MyChart before your provider has had a chance to review them.  We understand that in some cases there may be results that are confusing or concerning to you. Not all laboratory results come back in the same time frame and the provider may be waiting for multiple results in order to interpret others.  Please give [place] 48 hours in order for your provider to thoroughly review all the results before contacting the office for clarification of your results.

## 2020-08-28 ENCOUNTER — Encounter: Payer: Self-pay | Admitting: Gastroenterology

## 2020-09-02 ENCOUNTER — Encounter: Payer: BC Managed Care – PPO | Admitting: Gastroenterology

## 2020-10-14 ENCOUNTER — Encounter: Payer: Self-pay | Admitting: Gastroenterology

## 2020-10-14 ENCOUNTER — Ambulatory Visit (INDEPENDENT_AMBULATORY_CARE_PROVIDER_SITE_OTHER): Payer: BC Managed Care – PPO | Admitting: Gastroenterology

## 2020-10-14 VITALS — BP 112/62 | HR 68 | Ht 66.0 in | Wt 128.0 lb

## 2020-10-14 DIAGNOSIS — B356 Tinea cruris: Secondary | ICD-10-CM

## 2020-10-14 DIAGNOSIS — K649 Unspecified hemorrhoids: Secondary | ICD-10-CM

## 2020-10-14 DIAGNOSIS — K641 Second degree hemorrhoids: Secondary | ICD-10-CM

## 2020-10-14 MED ORDER — MICONAZOLE NITRATE 2 % EX CREA: 1.0000 "application " | TOPICAL_CREAM | Freq: Three times a day (TID) | CUTANEOUS | 1 refills | Status: DC

## 2020-10-14 NOTE — Progress Notes (Signed)
PROCEDURE NOTE: The patient presents with symptomatic grade II hemorrhoids, requesting rubber band ligation of his/her hemorrhoidal disease.  All risks, benefits and alternative forms of therapy were described and informed consent was obtained.  In the Left Lateral Decubitus position anoscopic examination revealed grade II hemorrhoids in the Left lateral, right posterior and right anterior position(s).  The anorectum was pre-medicated with 0.125% NTG and Recticare The decision was made to band the Left lateral internal hemorrhoid, and the Carlton was used to perform band ligation without complication.  Digital anorectal examination was then performed to assure proper positioning of the band, and to adjust the banded tissue as required.  The patient was discharged home without pain or other issues.  Dietary and behavioral recommendations were given and along with follow-up instructions.     The following adjunctive treatments were recommended:  Benefiber 1 tablespoon BID with meals Miconazole 2% small pea size amount in the peri anal area three times daily  The patient will return in 2-4 weeks for  follow-up and possible additional banding as required. No complications were encountered and the patient tolerated the procedure well.   Damaris Hippo , MD [phone]

## 2020-10-14 NOTE — Patient Instructions (Addendum)
HEMORRHOID BANDING PROCEDURE    FOLLOW-UP CARE   1. The procedure you have had should have been relatively painless since the banding of the area involved does not have nerve endings and there is no pain sensation.  The rubber band cuts off the blood supply to the hemorrhoid and the band may fall off as soon as 48 hours after the banding (the band may occasionally be seen in the toilet bowl following a bowel movement). You may notice a temporary feeling of fullness in the rectum which should respond adequately to plain Tylenol or Motrin.  2. Following the banding, avoid strenuous exercise that evening and resume full activity the next day.  A sitz bath (soaking in a warm tub) or bidet is soothing, and can be useful for cleansing the area after bowel movements.     3. To avoid constipation, take two tablespoons of natural wheat bran, natural oat bran, flax, Benefiber or any over the counter fiber supplement and increase your water intake to 7-8 glasses daily.    4. Unless you have been prescribed anorectal medication, do not put anything inside your rectum for two weeks: No suppositories, enemas, fingers, etc.  5. Occasionally, you may have more bleeding than usual after the banding procedure.  This is often from the untreated hemorrhoids rather than the treated one.  Don't be concerned if there is a tablespoon or so of blood.  If there is more blood than this, lie flat with your bottom higher than your head and apply an ice pack to the area. If the bleeding does not stop within a half an hour or if you feel faint, call our office at [phone] or go to the emergency room.  6. Problems are not common; however, if there is a substantial amount of bleeding, severe pain, chills, fever or difficulty passing urine (very rare) or other problems, you should call us at [phone] or report to the nearest emergency room.  7. Do not stay seated continuously for more than 2-3 hours for a day or two  after the procedure.  Tighten your buttock muscles 10-15 times every two hours and take 10-15 deep breaths every 1-2 hours.  Do not spend more than a few minutes on the toilet if you cannot empty your bowel; instead re-visit the toilet at a later time.  Use Benefiber 1 tablespoon twice a day with meals  We have sent in Miconazole to your pharmacy    Due to recent changes in healthcare laws, you may see the results of your imaging and laboratory studies on MyChart before your provider has had a chance to review them.  We understand that in some cases there may be results that are confusing or concerning to you. Not all laboratory results come back in the same time frame and the provider may be waiting for multiple results in order to interpret others.  Please give [place] 48 hours in order for your provider to thoroughly review all the results before contacting the office for clarification of your results.   I appreciate the  opportunity to care for you  Thank You   Harl Bowie , MD

## 2020-10-26 NOTE — Progress Notes (Signed)
Office Visit Note  Patient: Michelle Cunningham             Date of Birth: 1965-09-23           MRN: [id number]             PCP: Fanny Bien, MD Referring: Mauri Pole, MD Visit Date: 11/09/2020 Occupation: @GUAROCC @  Subjective:  No chief complaint on file.   History of Present Illness: Michelle Cunningham is a 55 y.o. female in consultation per request of Dr. Silverio Decamp.  According to the patient in 2020 she developed an ulcer on the roof of her mouth.  She states she saw her dentist who referred her to periodontist.  She was initially diagnosed with lichen planus which did not respond to the topical agents.  The ulcer became larger and there was a suspicion of Behcet's disease.  She had a biopsy of the ulcer whichwas inconclusive.  She states the oral ulcer persist and never did heal.  She states the ulcer is not painful but it gets irritated sometimes.  She also lost weight about 10 pounds in the last 1 year.  She states her gastroenterologist felt that it was due to gastric polyps.  She was also evaluated by her GYN and did not have any genital ulcers.  She was diagnosed with osteoporosis and started on Fosamax by her PCP.  She states she always had some blood in her stool due to hemorrhoids.  She went to see the gastroenterologist and had endoscopy and colonoscopy.  She had benign gastric polyps otherwise the work-up was unremarkable.  She underwent surgery for internal hemorrhoids x2.  She states about 5 months ago she developed a rash on her upper extremities which got better over time.  She also complains of some discomfort in her right elbow she plays tennis.  She has discomfort in her feet.  None of the other joints are painful.  She complains of eye sensitivity.  She has not seen an ophthalmologist yet.  Activities of Daily Living:  Patient reports morning stiffness for 5-10 minutes.   Patient Denies nocturnal pain.  Difficulty dressing/grooming: Denies Difficulty climbing  stairs: Denies Difficulty getting out of chair: Denies Difficulty using hands for taps, buttons, cutlery, and/or writing: Denies  Review of Systems  Constitutional: Positive for fatigue and weight loss. Negative for night sweats and weight gain.  HENT: Positive for mouth sores, ear ringing and mouth dryness. Negative for trouble swallowing, trouble swallowing and nose dryness.   Eyes: Positive for photophobia. Negative for pain, redness, itching, visual disturbance and dryness.  Respiratory: Negative for cough, shortness of breath and difficulty breathing.   Cardiovascular: Negative for chest pain, palpitations, hypertension, irregular heartbeat and swelling in legs/feet.  Gastrointestinal: Positive for blood in stool. Negative for constipation and diarrhea.  Endocrine: Positive for cold intolerance. Negative for increased urination.  Genitourinary: Negative for difficulty urinating and vaginal dryness.  Musculoskeletal: Positive for muscle weakness and morning stiffness. Negative for arthralgias, joint pain, joint swelling, myalgias, muscle tenderness and myalgias.  Skin: Positive for rash and sensitivity to sunlight. Negative for color change, hair loss, redness, skin tightness and ulcers.  Allergic/Immunologic: Negative for susceptible to infections.  Neurological: Negative for dizziness, numbness, headaches, memory loss, night sweats and weakness.  Hematological: Negative for bruising/bleeding tendency and swollen glands.  Psychiatric/Behavioral: Positive for sleep disturbance. Negative for depressed mood and confusion. The patient is not nervous/anxious.     PMFS History:  There are no problems  to display for this patient.   Past Medical History:  Diagnosis Date  . Anal fissure   . Diverticulosis   . HLD (hyperlipidemia)   . HTN (hypertension)   . Internal hemorrhoids   . Osteoarthritis   . Osteoporosis   . Tubular adenoma of colon     Family History  Problem Relation Age of  Onset  . Ulcerative colitis Brother   . Osteoporosis Paternal Grandmother   . Diabetes Paternal Grandfather   . Juvenile idiopathic arthritis Daughter    Past Surgical History:  Procedure Laterality Date  . transvaginal tape insertion     Social History   Social History Narrative  . Not on file   Immunization History  Administered Date(s) Administered  . PFIZER(Purple Top)SARS-COV-2 Vaccination 12/02/2019, 12/24/2019, 07/21/2020     Objective: Vital Signs: BP 138/79 (BP Location: Right Arm, Patient Position: Sitting, Cuff Size: Normal)   Pulse 62   Resp 15   Ht 5\' 7"  (1.702 m)   Wt 129 lb 9.6 oz (58.8 kg)   BMI 20.30 kg/m    Physical Exam Vitals and nursing note reviewed.  Constitutional:      Appearance: She is well-developed.  HENT:     Head: Normocephalic and atraumatic.     Mouth/Throat:     Comments: Aphthous ulcer was noted on the right side of her soft palate. Eyes:     Comments: Mild conjunctival injection was noted.  Cardiovascular:     Rate and Rhythm: Normal rate and regular rhythm.     Heart sounds: Normal heart sounds.  Pulmonary:     Effort: Pulmonary effort is normal.     Breath sounds: Normal breath sounds.  Abdominal:     General: Bowel sounds are normal.     Palpations: Abdomen is soft.  Musculoskeletal:     Cervical back: Normal range of motion.  Lymphadenopathy:     Cervical: No cervical adenopathy.  Skin:    General: Skin is warm and dry.     Capillary Refill: Capillary refill takes 2 to 3 seconds.  Neurological:     Mental Status: She is alert and oriented to person, place, and time.  Psychiatric:        Behavior: Behavior normal.      Musculoskeletal Exam: C-spine thoracic and lumbar spine with good range of motion.  Shoulder joints, elbow joints, wrist joints, MCPs PIPs and DIPs with good range of motion.  She had bilateral PIP and DIP thickening with no synovitis.  Hip joints, knee joints, ankles with good range of motion.  She  has bilateral pes cavus and hammertoes.  No synovitis was noted  CDAI Exam: CDAI Score: - Patient Global: -; Provider Global: - Swollen: -; Tender: - Joint Exam 11/09/2020   No joint exam has been documented for this visit   There is currently no information documented on the homunculus. Go to the Rheumatology activity and complete the homunculus joint exam.  Investigation: No additional findings.  Imaging: No results found.  Recent Labs: No results found for: WBC, HGB, PLT, NA, K, CL, CO2, GLUCOSE, BUN, CREATININE, BILITOT, ALKPHOS, AST, ALT, PROT, ALBUMIN, CALCIUM, GFRAA, QFTBGOLD, QFTBGOLDPLUS   September 24, 2019 biopsy of the oral lesion was nonspecific, may be consistent with lichen planus.  Speciality Comments: No specialty comments available.  Procedures:  No procedures performed Allergies: Patient has no known allergies.   Assessment / Plan:     Visit Diagnoses: Oral ulcer-patient has a persistent oral ulcer for last  2 years.  She has seen her dentist, periodontist and had biopsy which was inconsistent and suspicious of lichen planus.  She did not respond to the treatment.  Is been a suspicion about patients.  She has not had more than 1 lesion.  She has had persistent same lesion over the 2 years.  She has no history of genital ulcers.  No pathergy or dermographism was noted.  I will obtain labs today and also will refer her to dermatologist.  Rash-she states she developed a rash on her upper extremities about 5 months ago which eventually resolved.  I do not see any rash today.  Raynaud's phenomenon without gangrene-patient gives history of very cold fingers but does not describe typical Raynaud's phenomenon.  She had decreased capillary refill.  Lateral epicondylitis, right elbow-she had tenderness over right lateral epicondyle region consistent with epicondylitis.  She plays tennis.  Have given her a handout on exercises.  Primary osteoarthritis of both hands-clinical  findings are consistent with osteoarthritis.  No synovitis was noted.  Joint protection muscle strengthening was discussed.  Primary osteoarthritis of both feet-she has bilateral pes cavus and hammertoes consistent with osteoarthritis.  Erosive gastritis-evaluated by Dr. Silverio Decamp..  Eye redness-patient has mild conjunctival injection.  She also complains of photosensitivity.  I will refer her to ophthalmology to rule out uveitis.  Raynauds phenomenon-she states that her hands history cold.  Although she does not give typical history of pallor and bluish discoloration.  She decreased capillary refill.  I will obtain additional labs today.  Fatigue-she gives history of fatigue and weight loss.  Age-related osteoporosis without current pathological fracture - Fosamax is started 11/2019.  Followed by her PCP.  Family history of juvenile rheumatoid arthritis - daughter  Family history of ulcerative colitis - brother  Orders: Orders Placed This Encounter  Procedures  . CBC with Differential/Platelet  . COMPLETE METABOLIC PANEL WITH GFR  . Sedimentation rate  . Urinalysis, Routine w reflex microscopic  . ANA  . Pan-ANCA  . Anti-DNA antibody, double-stranded  . Sjogrens syndrome-B extractable nuclear antibody  . Sjogrens syndrome-A extractable nuclear antibody  . Anti-Smith antibody  . RNP Antibody  . Anti-scleroderma antibody  . C3 and C4  . Fluorescent treponemal ab(fta)-IgG-bld  . Ambulatory referral to Ophthalmology  . Ambulatory referral to Dermatology   No orders of the defined types were placed in this encounter.    Follow-Up Instructions: Return for Oral ulcer, osteoarthritis.   Bo Merino, MD  Note - This record has been created using Editor, commissioning.  Chart creation errors have been sought, but may not always  have been located. Such creation errors do not reflect on  the standard of medical care.

## 2020-10-28 ENCOUNTER — Encounter: Payer: Self-pay | Admitting: Gastroenterology

## 2020-10-28 ENCOUNTER — Other Ambulatory Visit: Payer: Self-pay

## 2020-10-28 ENCOUNTER — Ambulatory Visit (INDEPENDENT_AMBULATORY_CARE_PROVIDER_SITE_OTHER): Payer: BC Managed Care – PPO | Admitting: Gastroenterology

## 2020-10-28 VITALS — BP 114/68 | HR 88 | Ht 66.0 in | Wt 127.0 lb

## 2020-10-28 DIAGNOSIS — K641 Second degree hemorrhoids: Secondary | ICD-10-CM | POA: Diagnosis not present

## 2020-10-28 NOTE — Patient Instructions (Signed)
HEMORRHOID BANDING PROCEDURE    FOLLOW-UP CARE   1. The procedure you have had should have been relatively painless since the banding of the area involved does not have nerve endings and there is no pain sensation.  The rubber band cuts off the blood supply to the hemorrhoid and the band may fall off as soon as 48 hours after the banding (the band may occasionally be seen in the toilet bowl following a bowel movement). You may notice a temporary feeling of fullness in the rectum which should respond adequately to plain Tylenol or Motrin.  2. Following the banding, avoid strenuous exercise that evening and resume full activity the next day.  A sitz bath (soaking in a warm tub) or bidet is soothing, and can be useful for cleansing the area after bowel movements.     3. To avoid constipation, take two tablespoons of natural wheat bran, natural oat bran, flax, Benefiber or any over the counter fiber supplement and increase your water intake to 7-8 glasses daily.    4. Unless you have been prescribed anorectal medication, do not put anything inside your rectum for two weeks: No suppositories, enemas, fingers, etc.  5. Occasionally, you may have more bleeding than usual after the banding procedure.  This is often from the untreated hemorrhoids rather than the treated one.  Don't be concerned if there is a tablespoon or so of blood.  If there is more blood than this, lie flat with your bottom higher than your head and apply an ice pack to the area. If the bleeding does not stop within a half an hour or if you feel faint, call our office at [phone] or go to the emergency room.  6. Problems are not common; however, if there is a substantial amount of bleeding, severe pain, chills, fever or difficulty passing urine (very rare) or other problems, you should call us at [phone] or report to the nearest emergency room.  7. Do not stay seated continuously for more than 2-3 hours for a day or two  after the procedure.  Tighten your buttock muscles 10-15 times every two hours and take 10-15 deep breaths every 1-2 hours.  Do not spend more than a few minutes on the toilet if you cannot empty your bowel; instead re-visit the toilet at a later time.    I appreciate the  opportunity to care for you  Thank You   Harl Bowie , MD

## 2020-10-28 NOTE — Progress Notes (Signed)
PROCEDURE NOTE: The patient presents with symptomatic grade II  hemorrhoids, requesting rubber band ligation of his/her hemorrhoidal disease.  All risks, benefits and alternative forms of therapy were described and informed consent was obtained.   The anorectum was pre-medicated with 0.125% NTG and recticare The decision was made to band the Right posterior hemorrhoid  internal hemorrhoid, and the [place] was used to perform band ligation without complication.  Digital anorectal examination was then performed to assure proper positioning of the band, and to adjust the banded tissue as required.  The patient was discharged home without pain or other issues.  Dietary and behavioral recommendations were given and along with follow-up instructions.     The following adjunctive treatments were recommended: Continue Miconazole small pea size amount in the perianal area  The patient will return in 2-4 weeks for  follow-up and possible additional banding as required. No complications were encountered and the patient tolerated the procedure well.  Damaris Hippo , MD [phone]

## 2020-11-09 ENCOUNTER — Encounter: Payer: Self-pay | Admitting: Rheumatology

## 2020-11-09 ENCOUNTER — Ambulatory Visit (INDEPENDENT_AMBULATORY_CARE_PROVIDER_SITE_OTHER): Payer: BC Managed Care – PPO | Admitting: Rheumatology

## 2020-11-09 ENCOUNTER — Other Ambulatory Visit: Payer: Self-pay

## 2020-11-09 VITALS — BP 138/79 | HR 62 | Resp 15 | Ht 67.0 in | Wt 129.6 lb

## 2020-11-09 DIAGNOSIS — M810 Age-related osteoporosis without current pathological fracture: Secondary | ICD-10-CM

## 2020-11-09 DIAGNOSIS — M1990 Unspecified osteoarthritis, unspecified site: Secondary | ICD-10-CM

## 2020-11-09 DIAGNOSIS — M19042 Primary osteoarthritis, left hand: Secondary | ICD-10-CM

## 2020-11-09 DIAGNOSIS — M19041 Primary osteoarthritis, right hand: Secondary | ICD-10-CM

## 2020-11-09 DIAGNOSIS — K121 Other forms of stomatitis: Secondary | ICD-10-CM | POA: Diagnosis not present

## 2020-11-09 DIAGNOSIS — M19072 Primary osteoarthritis, left ankle and foot: Secondary | ICD-10-CM

## 2020-11-09 DIAGNOSIS — Z8379 Family history of other diseases of the digestive system: Secondary | ICD-10-CM

## 2020-11-09 DIAGNOSIS — K2960 Other gastritis without bleeding: Secondary | ICD-10-CM

## 2020-11-09 DIAGNOSIS — R21 Rash and other nonspecific skin eruption: Secondary | ICD-10-CM | POA: Diagnosis not present

## 2020-11-09 DIAGNOSIS — Z8261 Family history of arthritis: Secondary | ICD-10-CM

## 2020-11-09 DIAGNOSIS — R5383 Other fatigue: Secondary | ICD-10-CM | POA: Diagnosis not present

## 2020-11-09 DIAGNOSIS — M7711 Lateral epicondylitis, right elbow: Secondary | ICD-10-CM

## 2020-11-09 DIAGNOSIS — I7300 Raynaud's syndrome without gangrene: Secondary | ICD-10-CM | POA: Diagnosis not present

## 2020-11-09 DIAGNOSIS — K120 Recurrent oral aphthae: Secondary | ICD-10-CM

## 2020-11-09 DIAGNOSIS — M19071 Primary osteoarthritis, right ankle and foot: Secondary | ICD-10-CM

## 2020-11-09 DIAGNOSIS — H5789 Other specified disorders of eye and adnexa: Secondary | ICD-10-CM

## 2020-11-09 NOTE — Patient Instructions (Signed)
Hand Exercises Hand exercises can be helpful for almost anyone. These exercises can strengthen the hands, improve flexibility and movement, and increase blood flow to the hands. These results can make work and daily tasks easier. Hand exercises can be especially helpful for people who have joint pain from arthritis or have nerve damage from overuse (carpal tunnel syndrome). These exercises can also help people who have injured a hand. Exercises Most of these hand exercises are gentle stretching and motion exercises. It is usually safe to do them often throughout the day. Warming up your hands before exercise may help to reduce stiffness. You can do this with gentle massage or by placing your hands in warm water for 10-15 minutes. It is normal to feel some stretching, pulling, tightness, or mild discomfort as you begin new exercises. This will gradually improve. Stop an exercise right away if you feel sudden, severe pain or your pain gets worse. Ask your health care provider which exercises are best for you. Knuckle bend or "claw" fist 1. Stand or sit with your arm, hand, and all five fingers pointed straight up. Make sure to keep your wrist straight during the exercise. 2. Gently bend your fingers down toward your palm until the tips of your fingers are touching the top of your palm. Keep your big knuckle straight and just bend the small knuckles in your fingers. 3. Hold this position for __________ seconds. 4. Straighten (extend) your fingers back to the starting position. Repeat this exercise 5-10 times with each hand. Full finger fist 1. Stand or sit with your arm, hand, and all five fingers pointed straight up. Make sure to keep your wrist straight during the exercise. 2. Gently bend your fingers into your palm until the tips of your fingers are touching the middle of your palm. 3. Hold this position for __________ seconds. 4. Extend your fingers back to the starting position, stretching every  joint fully. Repeat this exercise 5-10 times with each hand. Straight fist 1. Stand or sit with your arm, hand, and all five fingers pointed straight up. Make sure to keep your wrist straight during the exercise. 2. Gently bend your fingers at the big knuckle, where your fingers meet your hand, and the middle knuckle. Keep the knuckle at the tips of your fingers straight and try to touch the bottom of your palm. 3. Hold this position for __________ seconds. 4. Extend your fingers back to the starting position, stretching every joint fully. Repeat this exercise 5-10 times with each hand. Tabletop 1. Stand or sit with your arm, hand, and all five fingers pointed straight up. Make sure to keep your wrist straight during the exercise. 2. Gently bend your fingers at the big knuckle, where your fingers meet your hand, as far down as you can while keeping the small knuckles in your fingers straight. Think of forming a tabletop with your fingers. 3. Hold this position for __________ seconds. 4. Extend your fingers back to the starting position, stretching every joint fully. Repeat this exercise 5-10 times with each hand. Finger spread 1. Place your hand flat on a table with your palm facing down. Make sure your wrist stays straight as you do this exercise. 2. Spread your fingers and thumb apart from each other as far as you can until you feel a gentle stretch. Hold this position for __________ seconds. 3. Bring your fingers and thumb tight together again. Hold this position for __________ seconds. Repeat this exercise 5-10 times with each hand.   Making circles 1. Stand or sit with your arm, hand, and all five fingers pointed straight up. Make sure to keep your wrist straight during the exercise. 2. Make a circle by touching the tip of your thumb to the tip of your index finger. 3. Hold for __________ seconds. Then open your hand wide. 4. Repeat this motion with your thumb and each finger on your  hand. Repeat this exercise 5-10 times with each hand. Thumb motion 1. Sit with your forearm resting on a table and your wrist straight. Your thumb should be facing up toward the ceiling. Keep your fingers relaxed as you move your thumb. 2. Lift your thumb up as high as you can toward the ceiling. Hold for __________ seconds. 3. Bend your thumb across your palm as far as you can, reaching the tip of your thumb for the small finger (pinkie) side of your palm. Hold for __________ seconds. Repeat this exercise 5-10 times with each hand. Grip strengthening 1. Hold a stress ball or other soft ball in the middle of your hand. 2. Slowly increase the pressure, squeezing the ball as much as you can without causing pain. Think of bringing the tips of your fingers into the middle of your palm. All of your finger joints should bend when doing this exercise. 3. Hold your squeeze for __________ seconds, then relax. Repeat this exercise 5-10 times with each hand.   Contact a health care provider if:  Your hand pain or discomfort gets much worse when you do an exercise.  Your hand pain or discomfort does not improve within 2 hours after you exercise. If you have any of these problems, stop doing these exercises right away. Do not do them again unless your health care provider says that you can. Get help right away if:  You develop sudden, severe hand pain or swelling. If this happens, stop doing these exercises right away. Do not do them again unless your health care provider says that you can. This information is not intended to replace advice given to you by your health care provider. Make sure you discuss any questions you have with your health care provider. Document Revised: 12/06/2018 Document Reviewed: 08/16/2018 Elsevier Patient Education  2021 [place]. Elbow and Forearm Exercises Ask your health care provider which exercises are safe for you. Do exercises exactly as told by your health care  provider and adjust them as directed. It is normal to feel mild stretching, pulling, tightness, or discomfort as you do these exercises. Stop right away if you feel sudden pain or your pain gets worse. Do not begin these exercises until told by your health care provider. Range-of-motion exercises These exercises warm up your muscles and joints and improve the movement and flexibility of your injured elbow and forearm. The exercises also help to relieve pain, numbness, and tingling. These exercises are done using the muscles in your injured elbow and forearm (active). Elbow flexion, active 1. Hold your left / right arm at your side, and bend your elbow (flexion) as far as you can using only your arm muscles. 2. Hold this position for __________ seconds. 3. Slowly return to the starting position. Repeat __________ times. Complete this exercise __________ times a day. Elbow extension, active 1. Hold your left / right arm at your side, and straighten your elbow (extension) as much as you can using only your arm muscles. 2. Hold this position for __________ seconds. 3. Slowly return to the starting position. Repeat __________ times. Complete  this exercise __________ times a day. Active forearm rotation, supination This is an exercise in which you turn (rotate) your forearm palm up (supination). 1. Stand or sit with your elbows at your sides. 2. Bend your left / right elbow to a 90-degree angle (right angle). 3. Rotate your palm up until you feel a gentle stretch on the inside of your forearm. 4. Hold this position for __________ seconds. 5. Slowly return to the starting position. Repeat __________ times. Complete this exercise __________ times a day. Active forearm rotation, pronation This is an exercise in which you turn (rotate) your forearm palm down (pronation). 1. Stand or sit with your elbows at your sides. 2. Bend your left / right elbow to a 90-degree angle (right angle). 3. Rotate your  palm down until you feel a gentle stretch on the top of your forearm. 4. Hold this position for __________ seconds. 5. Slowly return to the starting position. Repeat __________ times. Complete this exercise __________ times a day. Stretching exercises These exercises warm up your muscles and joints and improve the movement and flexibility of your injured elbow and forearm. These exercises also help to relieve pain, numbness, and tingling. These exercises are done using your healthy elbow and forearm to help stretch the muscles in your injured elbow and forearm (active-assisted). Elbow flexion, active-assisted 1. Hold your left / right arm at your side, and bend your elbow (flexion) as much as you can using your left / right arm muscles. 2. Use your other hand to bend your left / right elbow farther. To do this, gently push up on your forearm until you feel a gentle stretch on the back of your elbow. 3. Hold this position for __________ seconds. 4. Slowly return to the starting position. Repeat __________ times. Complete this exercise __________ times a day.   Elbow extension, active-assisted 1. Hold your left / right arm at your side, and straighten your elbow (extension) as much as you can using your left / right arm muscles. 2. Use your other hand to straighten the left / right elbow farther. To do this, gently push down on your forearm until you feel a gentle stretch on the inside of your elbow. 3. Hold this position for __________ seconds. 4. Slowly return to the starting position. Repeat __________ times. Complete this exercise __________ times a day.   Active-assisted forearm rotation, supination This is an exercise in which you turn (rotate) your forearm palm up (supination). 1. Sit with your left / right elbow bent in a 90-degree angle (right angle) with your forearm resting on a table. 2. Keeping your upper body and shoulder still, rotate your forearm so your palm faces upward. 3. Use  your other hand to help rotate your forearm further until you feel a gentle to moderate stretch. 4. Hold this position for __________ seconds. 5. Slowly release the stretch and return to the starting position. Repeat __________ times. Complete this exercise __________ times a day. Active-assisted forearm rotation, pronation This is an exercise in which you turn (rotate) your forearm palm down (pronation). 1. Sit with your left / right elbow bent in a 90-degree angle (right angle) with your forearm resting on a table. 2. Keeping your upper body and shoulder still, rotate your forearm so your palm faces the tabletop. 3. Use your other hand to help rotate your forearm further until you feel a gentle to moderate stretch. 4. Hold this position for __________ seconds. 5. Slowly release the stretch and return to  the starting position. Repeat __________ times. Complete this exercise __________ times a day.   Passive elbow flexion, supine 1. Lie on your back (supine position). 2. Extend your left / right arm up in the air, bracing it with your other hand. 3. Let your left / right hand slowly lower toward your shoulder (passive flexion), while your elbow stays pointed toward the ceiling. You should feel a gentle stretch along the back of your upper arm and elbow. 4. If instructed by your health care provider, you may increase the intensity of your stretch by adding a small wrist weight or hand weight. 5. Hold this position for __________ seconds. 6. Slowly return to the starting position. Repeat __________ times. Complete this exercise __________ times a day. Passive elbow extension, supine 1. Lie on your back (supine position). Make sure that you are in a comfortable position that lets you relax your arm muscles. 2. Place a folded towel under your left / right upper arm so your elbow and shoulder are at the same height. Straighten your left / right arm so your elbow does not rest on the bed or  towel. 3. Let the weight of your hand stretch your elbow (passive extension). Keep your arm and chest muscles relaxed. You should feel a stretch on the inside of your elbow. 4. If told by your health care provider, you may increase the intensity of your stretch by adding a small wrist weight or hand weight. 5. Hold this position for __________ seconds. 6. Slowly release the stretch. Repeat __________ times. Complete this exercise __________ times a day.   Strengthening exercises These exercises build strength and endurance in your elbow and forearm. Endurance is the ability to use your muscles for a long time, even after they get tired. Elbow flexion, isometric 1. Stand or sit up straight. 2. Bend your left / right elbow in a 90-degree angle (right angle), and keep your forearm at the height of your waist. Your thumb should be pointed toward the ceiling (neutral forearm). 3. Place your other hand on top of your left / right forearm. Gently push down while you resist with your left / right arm (isometric flexion). Push as hard as you can with both arms without causing any pain or movement at your left / right elbow. 4. Hold this position for __________ seconds. 5. Slowly release the tension in both arms. Let your muscles relax completely before you repeat the exercise. Repeat __________ times. Complete this exercise __________ times a day.   Elbow extension, isometric 1. Stand or sit up straight. 2. Place your left / right arm so your palm faces your abdomen and is at the height of your waist. 3. Place your other hand on the underside of your left / right forearm. Gently push up while you resist with your left / right arm (isometric extension). Push as hard as you can with both arms without causing any pain or movement at your left / right elbow. 4. Hold this position for __________ seconds. 5. Slowly release the tension in both arms. Let your muscles relax completely before you repeat the  exercise. Repeat __________ times. Complete this exercise __________ times a day.   Elbow flexion with forearm palm up 1. Sit on a firm chair without armrests, or stand up. 2. Place your left / right arm at your side with your elbow straight and your palm facing forward. 3. Holding a __________weight or gripping a rubber exercise band or tubing, bend your elbow  to bring your hand toward your shoulder (flexion). 4. Hold this position for __________ seconds. 5. Slowly return to the starting position. Repeat __________ times. Complete this exercise __________ times a day.   Elbow extension, active 1. Sit on a firm chair without armrests, or stand up. 2. Hold a rubber exercise band or tubing in both hands. 3. Keeping your upper arms at your sides, bring both hands up to your left / right shoulder. Keep your left / right hand just below your other hand. 4. Straighten your left / right elbow (extension) while keeping your other arm still. 5. Hold this position for __________ seconds. 6. Control the resistance of the band or tubing as you return to the starting position. Repeat __________ times. Complete this exercise __________ times a day.   Forearm rotation, supination 1. Sit with your left / right forearm supported on a table. Your elbow should be at waist height and bent at a 90-degree angle (right angle). 2. Gently grasp a lightweight hammer. 3. Rest your hand over the edge of the table with your palm facing down. 4. Without moving your left / right elbow, slowly rotate your forearm to turn your palm up toward the ceiling (supination). 5. Hold this position for __________ seconds. 6. Slowly return to the starting position. Repeat __________ times. Complete this exercise __________ times a day.   Forearm rotation, pronation 1. Sit with your left / right forearm supported on a table. Keep your elbow below shoulder height. 2. Gently grasp a lightweight hammer. 3. Rest your hand over the edge  of the table with your palm facing up. 4. Without moving your left / right elbow, slowly rotate your forearm to turn your palm down toward the floor (pronation). 5. Hold this position for __________seconds. 6. Slowly return to the starting position. Repeat __________ times. Complete this exercise __________ times a day.   This information is not intended to replace advice given to you by your health care provider. Make sure you discuss any questions you have with your health care provider. Document Revised: 12/06/2018 Document Reviewed: 09/05/2018 Elsevier Patient Education  [place].

## 2020-11-10 DIAGNOSIS — H16223 Keratoconjunctivitis sicca, not specified as Sjogren's, bilateral: Secondary | ICD-10-CM | POA: Diagnosis not present

## 2020-11-10 DIAGNOSIS — H5789 Other specified disorders of eye and adnexa: Secondary | ICD-10-CM | POA: Diagnosis not present

## 2020-11-10 DIAGNOSIS — H16143 Punctate keratitis, bilateral: Secondary | ICD-10-CM | POA: Diagnosis not present

## 2020-11-10 NOTE — Progress Notes (Signed)
I will discuss results at the follow-up visit.

## 2020-11-11 LAB — COMPLETE METABOLIC PANEL WITH GFR
AG Ratio: 1.9 (calc) (ref 1.0–2.5)
ALT: 21 U/L (ref 6–29)
AST: 20 U/L (ref 10–35)
Albumin: 4.7 g/dL (ref 3.6–5.1)
Alkaline phosphatase (APISO): 50 U/L (ref 37–153)
BUN: 11 mg/dL (ref 7–25)
CO2: 30 mmol/L (ref 20–32)
Calcium: 9.5 mg/dL (ref 8.6–10.4)
Chloride: 104 mmol/L (ref 98–110)
Creat: 0.73 mg/dL (ref 0.50–1.05)
GFR, Est African American: 107 mL/min/{1.73_m2} (ref >=60)
GFR, Est Non African American: 92 mL/min/{1.73_m2} (ref >=60)
Globulin: 2.5 g/dL (calc) (ref 1.9–3.7)
Glucose, Bld: 105 mg/dL — ABNORMAL HIGH (ref 65–99)
Potassium: 4.4 mmol/L (ref 3.5–5.3)
Sodium: 141 mmol/L (ref 135–146)
Total Bilirubin: 0.5 mg/dL (ref 0.2–1.2)
Total Protein: 7.2 g/dL (ref 6.1–8.1)

## 2020-11-11 LAB — URINALYSIS, ROUTINE W REFLEX MICROSCOPIC
Bilirubin Urine: NEGATIVE
Glucose, UA: NEGATIVE
Hgb urine dipstick: NEGATIVE
Ketones, ur: NEGATIVE
Leukocytes,Ua: NEGATIVE
Nitrite: NEGATIVE
Protein, ur: NEGATIVE
Specific Gravity, Urine: 1.021 (ref 1.001–1.03)
pH: >=8.5 — AB (ref 5.0–8.0)

## 2020-11-11 LAB — SJOGRENS SYNDROME-B EXTRACTABLE NUCLEAR ANTIBODY: SSB (La) (ENA) Antibody, IgG: <1 AI

## 2020-11-11 LAB — CBC WITH DIFFERENTIAL/PLATELET
Absolute Monocytes: 470 cells/uL (ref 200–950)
Basophils Absolute: 40 cells/uL (ref 0–200)
Basophils Relative: 0.8 %
Eosinophils Absolute: 120 cells/uL (ref 15–500)
Eosinophils Relative: 2.4 %
HCT: 42.3 % (ref 35.0–45.0)
Hemoglobin: 14.4 g/dL (ref 11.7–15.5)
Lymphs Abs: 1505 cells/uL (ref 850–3900)
MCH: 31 pg (ref 27.0–33.0)
MCHC: 34 g/dL (ref 32.0–36.0)
MCV: 91.2 fL (ref 80.0–100.0)
MPV: 9.3 fL (ref 7.5–12.5)
Monocytes Relative: 9.4 %
Neutro Abs: 2865 cells/uL (ref 1500–7800)
Neutrophils Relative %: 57.3 %
Platelets: 370 10*3/uL (ref 140–400)
RBC: 4.64 10*6/uL (ref 3.80–5.10)
RDW: 12.7 % (ref 11.0–15.0)
Total Lymphocyte: 30.1 %
WBC: 5 10*3/uL (ref 3.8–10.8)

## 2020-11-11 LAB — SJOGRENS SYNDROME-A EXTRACTABLE NUCLEAR ANTIBODY: SSA (Ro) (ENA) Antibody, IgG: <1 AI

## 2020-11-11 LAB — FLUORESCENT TREPONEMAL AB(FTA)-IGG-BLD: Fluorescent Treponemal ABS: NONREACTIVE

## 2020-11-11 LAB — PAN-ANCA
ANCA Screen: NEGATIVE
Myeloperoxidase Abs: <1 AI
Serine Protease 3: <1 AI

## 2020-11-11 LAB — RNP ANTIBODY: Ribonucleic Protein(ENA) Antibody, IgG: <1 AI

## 2020-11-11 LAB — ANTI-DNA ANTIBODY, DOUBLE-STRANDED: ds DNA Ab: <1 IU/mL

## 2020-11-11 LAB — C3 AND C4
C3 Complement: 98 mg/dL (ref 83–193)
C4 Complement: 15 mg/dL (ref 15–57)

## 2020-11-11 LAB — ANTI-SCLERODERMA ANTIBODY: Scleroderma (Scl-70) (ENA) Antibody, IgG: <1 AI

## 2020-11-11 LAB — ANA: Anti Nuclear Antibody (ANA): NEGATIVE

## 2020-11-11 LAB — SEDIMENTATION RATE: Sed Rate: 9 mm/h (ref 0–30)

## 2020-11-11 LAB — ANTI-SMITH ANTIBODY: ENA SM Ab Ser-aCnc: <1 AI

## 2020-11-18 DIAGNOSIS — H16223 Keratoconjunctivitis sicca, not specified as Sjogren's, bilateral: Secondary | ICD-10-CM | POA: Diagnosis not present

## 2020-11-18 DIAGNOSIS — H5789 Other specified disorders of eye and adnexa: Secondary | ICD-10-CM | POA: Diagnosis not present

## 2020-11-18 DIAGNOSIS — H16143 Punctate keratitis, bilateral: Secondary | ICD-10-CM | POA: Diagnosis not present

## 2020-11-25 ENCOUNTER — Other Ambulatory Visit: Payer: Self-pay | Admitting: Radiology

## 2020-11-25 DIAGNOSIS — K121 Other forms of stomatitis: Secondary | ICD-10-CM

## 2020-12-01 ENCOUNTER — Other Ambulatory Visit: Payer: Self-pay

## 2020-12-01 ENCOUNTER — Encounter: Payer: Self-pay | Admitting: Gastroenterology

## 2020-12-01 ENCOUNTER — Ambulatory Visit (INDEPENDENT_AMBULATORY_CARE_PROVIDER_SITE_OTHER): Payer: BC Managed Care – PPO | Admitting: Gastroenterology

## 2020-12-01 VITALS — BP 106/72 | HR 70 | Ht 67.0 in | Wt 126.5 lb

## 2020-12-01 DIAGNOSIS — K641 Second degree hemorrhoids: Secondary | ICD-10-CM | POA: Diagnosis not present

## 2020-12-01 NOTE — Progress Notes (Signed)
PROCEDURE NOTE: The patient presents with symptomatic grade II  hemorrhoids, requesting rubber band ligation of his/her hemorrhoidal disease.  All risks, benefits and alternative forms of therapy were described and informed consent was obtained.   The anorectum was pre-medicated with 0.125% NTG and Recticare The decision was made to band the Right anterior internal hemorrhoid, and the [place] was used to perform band ligation without complication.  Digital anorectal examination was then performed to assure proper positioning of the band, and to adjust the banded tissue as required.  The patient was discharged home without pain or other issues.  Dietary and behavioral recommendations were given and along with follow-up instructions.      The patient will return for  follow-up and possible additional banding as needed. No complications were encountered and the patient tolerated the procedure well.  Damaris Hippo , MD [phone]

## 2020-12-01 NOTE — Patient Instructions (Signed)
Follow up as needed  [place]   1. The procedure you have had should have been relatively painless since the banding of the area involved does not have nerve endings and there is no pain sensation.  The rubber band cuts off the blood supply to the hemorrhoid and the band may fall off as soon as 48 hours after the banding (the band may occasionally be seen in the toilet bowl following a bowel movement). You may notice a temporary feeling of fullness in the rectum which should respond adequately to plain Tylenol or Motrin.  2. Following the banding, avoid strenuous exercise that evening and resume full activity the next day.  A sitz bath (soaking in a warm tub) or bidet is soothing, and can be useful for cleansing the area after bowel movements.     3. To avoid constipation, take two tablespoons of natural wheat bran, natural oat bran, flax, Benefiber or any over the counter fiber supplement and increase your water intake to 7-8 glasses daily.    4. Unless you have been prescribed anorectal medication, do not put anything inside your rectum for two weeks: No suppositories, enemas, fingers, etc.  5. Occasionally, you may have more bleeding than usual after the banding procedure.  This is often from the untreated hemorrhoids rather than the treated one.  Don't be concerned if there is a tablespoon or so of blood.  If there is more blood than this, lie flat with your bottom higher than your head and apply an ice pack to the area. If the bleeding does not stop within a half an hour or if you feel faint, call our office at [phone] or go to the emergency room.  6. Problems are not common; however, if there is a substantial amount of bleeding, severe pain, chills, fever or difficulty passing urine (very rare) or other problems, you should call us at [phone] or report to the nearest emergency room.  7. Do not stay seated continuously for more than 2-3  hours for a day or two after the procedure.  Tighten your buttock muscles 10-15 times every two hours and take 10-15 deep breaths every 1-2 hours.  Do not spend more than a few minutes on the toilet if you cannot empty your bowel; instead re-visit the toilet at a later time.      Due to recent changes in healthcare laws, you may see the results of your imaging and laboratory studies on MyChart before your provider has had a chance to review them.  We understand that in some cases there may be results that are confusing or concerning to you. Not all laboratory results come back in the same time frame and the provider may be waiting for multiple results in order to interpret others.  Please give [place] 48 hours in order for your provider to thoroughly review all the results before contacting the office for clarification of your results.   I appreciate the  opportunity to care for you  Thank You   Harl Bowie , MD

## 2020-12-14 NOTE — Progress Notes (Signed)
Office Visit Note  Patient: Michelle Cunningham             Date of Birth: 1966-02-11           MRN: [id number]             PCP: Fanny Bien, MD Referring: Fanny Bien, MD Visit Date: 12/16/2020 Occupation: _0 @  Subjective:  Oral ulcers, dry mouth and dry eyes.   History of Present Illness: Michelle Cunningham is a 55 y.o. female with history of recurrent oral ulcers.  She states that she went to see the ophthalmologist and was diagnosed with dry eyes.  She was started on Restasis.  She states she is been using over-the-counter eye drops which has been helpful.  She also started using Biotene products as recommended at the last visit.  She has noticed some improvement in her symptoms.  She continues to have some oral ulcers.  She has an appointment with the oral surgeon for the biopsy of the lesion.  She also has an appointment with the dermatologist.  She has had no recurrence of the rash.  She believes she has raynaud's phenominon.  Activities of Daily Living:  Patient reports morning stiffness for 5 minutes.   Patient Denies nocturnal pain.  Difficulty dressing/grooming: Denies Difficulty climbing stairs: Denies Difficulty getting out of chair: Denies Difficulty using hands for taps, buttons, cutlery, and/or writing: Reports  Review of Systems  Constitutional: Negative for fatigue.  HENT: Positive for mouth sores, mouth dryness and nose dryness.   Eyes: Positive for dryness.  Respiratory: Negative for shortness of breath and difficulty breathing.   Cardiovascular: Negative for chest pain and palpitations.  Gastrointestinal: Negative for blood in stool, constipation and diarrhea.  Endocrine: Negative for increased urination.  Genitourinary: Negative for difficulty urinating.  Musculoskeletal: Positive for arthralgias, joint pain and morning stiffness. Negative for joint swelling, myalgias, muscle tenderness and myalgias.  Skin: Positive for color change.  Negative for rash and redness.  Allergic/Immunologic: Negative for susceptible to infections.  Neurological: Positive for numbness. Negative for dizziness, headaches, memory loss and weakness.  Hematological: Negative for bruising/bleeding tendency.  Psychiatric/Behavioral: Negative for confusion.    PMFS History:  Patient Active Problem List   Diagnosis Date Noted  . Erosive gastritis 12/16/2020  . Age-related osteoporosis without current pathological fracture 12/16/2020  . Primary osteoarthritis of both feet 12/16/2020  . Primary osteoarthritis of both hands 12/16/2020  . Raynaud's phenomenon without gangrene 12/16/2020  . Sicca complex ([place]) 12/16/2020    Past Medical History:  Diagnosis Date  . Anal fissure   . Diverticulosis   . HLD (hyperlipidemia)   . HTN (hypertension)   . Internal hemorrhoids   . Osteoarthritis   . Osteoporosis   . Tubular adenoma of colon     Family History  Problem Relation Age of Onset  . Ulcerative colitis Brother   . Osteoporosis Paternal Grandmother   . Diabetes Paternal Grandfather   . Juvenile idiopathic arthritis Daughter    Past Surgical History:  Procedure Laterality Date  . transvaginal tape insertion     Social History   Social History Narrative  . Not on file   Immunization History  Administered Date(s) Administered  . PFIZER(Purple Top)SARS-COV-2 Vaccination 12/02/2019, 12/24/2019, 07/21/2020     Objective: Vital Signs: BP 129/77 (BP Location: Right Arm, Patient Position: Sitting, Cuff Size: Normal)   Pulse (!) 59   Resp 12   Ht _1  (1.702 m)   Wt 127 lb  12.8 oz (58 kg)   BMI 20.02 kg/m    Physical Exam Vitals and nursing note reviewed.  Constitutional:      Appearance: She is well-developed.  HENT:     Head: Normocephalic and atraumatic.  Eyes:     Conjunctiva/sclera: Conjunctivae normal.  Cardiovascular:     Rate and Rhythm: Normal rate and regular rhythm.     Heart sounds: Normal heart sounds.   Pulmonary:     Effort: Pulmonary effort is normal.     Breath sounds: Normal breath sounds.  Abdominal:     General: Bowel sounds are normal.     Palpations: Abdomen is soft.  Musculoskeletal:     Cervical back: Normal range of motion.  Lymphadenopathy:     Cervical: No cervical adenopathy.  Skin:    General: Skin is warm and dry.     Capillary Refill: Capillary refill takes less than 2 seconds.  Neurological:     Mental Status: She is alert and oriented to person, place, and time.  Psychiatric:        Behavior: Behavior normal.      Musculoskeletal Exam: C-spine + range of motion.  Shoulder joints, elbow joints, wrist joints with good range of motion.  She has PIP and DIP thickening in her hands and feet consistent with osteoarthritis.  No synovitis was noted.  Hip joints and knee joints in good range of motion.  She had no tenderness or synovitis on examination.  CDAI Exam: CDAI Score: -- Patient Global: --; Provider Global: -- Swollen: --; Tender: -- Joint Exam 12/16/2020   No joint exam has been documented for this visit   There is currently no information documented on the homunculus. Go to the Rheumatology activity and complete the homunculus joint exam.  Investigation: No additional findings.  Imaging: No results found.  Recent Labs: Lab Results  Component Value Date   WBC 5.0 11/09/2020   HGB 14.4 11/09/2020   PLT 370 11/09/2020   NA 141 11/09/2020   K 4.4 11/09/2020   CL 104 11/09/2020   CO2 30 11/09/2020   GLUCOSE 105 (H) 11/09/2020   BUN 11 11/09/2020   CREATININE 0.73 11/09/2020   BILITOT 0.5 11/09/2020   AST 20 11/09/2020   ALT 21 11/09/2020   PROT 7.2 11/09/2020   CALCIUM 9.5 11/09/2020   GFRAA 107 11/09/2020   November 09 2020 ANCA negative, MPO antibody negative, serine proteinase 3 negative, ANA negative, ENA negative, ESR 9 complements normal, FTA antibody nonreactive, UA negative   Speciality Comments: No specialty comments  available.  Procedures:  No procedures performed Allergies: Patient has no known allergies.   Assessment / Plan:     Visit Diagnoses: Oral ulcer - Recurrent oral ulcers x2 years.  Evaluated by dentist and periodontist.  Biopsy suspicious of lichen planus.  All autoimmune work-up negative.  Her appointment with dermatologist is pending for August.  She has an appointment with the oral surgeon in May.  Sicca complex ([place]) -she continues to have dry mouth and dry eyes.  All serology was negative.  Lab values were discussed at length.  We briefly discussed possibility of seronegative Sjogren's.  She would like to be referred to [facility] for second opinion.  I will place that referral.  Plan: Ambulatory referral to Rheumatology.  Over-the-counter products for dry mouth were discussed.  Also also discussed possible use of pilocarpine.  Indications side effects contraindications were discussed at length.  She patient wants to proceed with pilocarpine.  Prescription  for pilocarpine 5 mg p.o. 3 times daily as needed was sent.  Eye redness -she was evaluated by ophthalmologist and was diagnosed with dry eyes.  She was placed on Restasis.  She has been using over-the-counter eyedrops.  She has noticed improvement in her eye symptoms.  Rash - History of rash on upper extremities October 2021.  No recurrence of the rash.  Raynaud's phenomenon without gangrene - History of cold fingers but not typical Raynauds symptoms.  No nailbed capillary changes or sclerodactyly was noted.  She has good capillary refill.  Lateral epicondylitis, right elbow - A handout on exercises was given at the last visit.  Primary osteoarthritis of both hands - Clinical findings are consistent with osteoarthritis.  Joint protection muscle strengthening was discussed.  Primary osteoarthritis of both feet - She has bilateral pes cavus and hammertoes.  Shoes with arch support were discussed.  Erosive gastritis - Followed by Dr.  Silverio Decamp.  Other fatigue - History of fatigue and weight loss.  Age-related osteoporosis without current pathological fracture - On Fosamax since April 2021 by her PCP.  Family history of juvenile rheumatoid arthritis - Daughter.  Family history of ulcerative colitis - Brother.  Orders: Orders Placed This Encounter  Procedures  . Ambulatory referral to Rheumatology   Meds ordered this encounter  Medications  . pilocarpine (SALAGEN) 5 MG tablet    Sig: Take 1 tablet (5 mg total) by mouth 3 (three) times daily as needed.    Dispense:  90 tablet    Refill:  2     Follow-Up Instructions: Return in about 3 months (around 03/17/2021) for sicca.   Bo Merino, MD  Note - This record has been created using Editor, commissioning.  Chart creation errors have been sought, but may not always  have been located. Such creation errors do not reflect on  the standard of medical care.

## 2020-12-16 ENCOUNTER — Encounter: Payer: Self-pay | Admitting: Rheumatology

## 2020-12-16 ENCOUNTER — Ambulatory Visit (INDEPENDENT_AMBULATORY_CARE_PROVIDER_SITE_OTHER): Payer: BC Managed Care – PPO | Admitting: Rheumatology

## 2020-12-16 ENCOUNTER — Other Ambulatory Visit: Payer: Self-pay

## 2020-12-16 VITALS — BP 129/77 | HR 59 | Resp 12 | Ht 67.0 in | Wt 127.8 lb

## 2020-12-16 DIAGNOSIS — Z8261 Family history of arthritis: Secondary | ICD-10-CM

## 2020-12-16 DIAGNOSIS — R21 Rash and other nonspecific skin eruption: Secondary | ICD-10-CM

## 2020-12-16 DIAGNOSIS — M7711 Lateral epicondylitis, right elbow: Secondary | ICD-10-CM

## 2020-12-16 DIAGNOSIS — K121 Other forms of stomatitis: Secondary | ICD-10-CM | POA: Diagnosis not present

## 2020-12-16 DIAGNOSIS — H5789 Other specified disorders of eye and adnexa: Secondary | ICD-10-CM | POA: Diagnosis not present

## 2020-12-16 DIAGNOSIS — K2960 Other gastritis without bleeding: Secondary | ICD-10-CM

## 2020-12-16 DIAGNOSIS — I7300 Raynaud's syndrome without gangrene: Secondary | ICD-10-CM

## 2020-12-16 DIAGNOSIS — M3500 Sicca syndrome, unspecified: Secondary | ICD-10-CM | POA: Diagnosis not present

## 2020-12-16 DIAGNOSIS — M19041 Primary osteoarthritis, right hand: Secondary | ICD-10-CM

## 2020-12-16 DIAGNOSIS — M810 Age-related osteoporosis without current pathological fracture: Secondary | ICD-10-CM | POA: Insufficient documentation

## 2020-12-16 DIAGNOSIS — M19072 Primary osteoarthritis, left ankle and foot: Secondary | ICD-10-CM

## 2020-12-16 DIAGNOSIS — M19071 Primary osteoarthritis, right ankle and foot: Secondary | ICD-10-CM

## 2020-12-16 DIAGNOSIS — R5383 Other fatigue: Secondary | ICD-10-CM

## 2020-12-16 DIAGNOSIS — M19042 Primary osteoarthritis, left hand: Secondary | ICD-10-CM | POA: Insufficient documentation

## 2020-12-16 DIAGNOSIS — Z8379 Family history of other diseases of the digestive system: Secondary | ICD-10-CM

## 2020-12-16 MED ORDER — PILOCARPINE HCL 5 MG PO TABS: 5.0000 mg | ORAL_TABLET | Freq: Three times a day (TID) | ORAL | 2 refills | Status: DC | PRN

## 2020-12-22 DIAGNOSIS — H9319 Tinnitus, unspecified ear: Secondary | ICD-10-CM | POA: Diagnosis not present

## 2020-12-22 DIAGNOSIS — Z0000 Encounter for general adult medical examination without abnormal findings: Secondary | ICD-10-CM | POA: Diagnosis not present

## 2020-12-22 DIAGNOSIS — L438 Other lichen planus: Secondary | ICD-10-CM | POA: Diagnosis not present

## 2020-12-22 DIAGNOSIS — M350C Sjogren syndrome with dental involvement: Secondary | ICD-10-CM | POA: Diagnosis not present

## 2020-12-30 DIAGNOSIS — H5211 Myopia, right eye: Secondary | ICD-10-CM | POA: Diagnosis not present

## 2020-12-30 DIAGNOSIS — H16143 Punctate keratitis, bilateral: Secondary | ICD-10-CM | POA: Diagnosis not present

## 2020-12-30 DIAGNOSIS — H52223 Regular astigmatism, bilateral: Secondary | ICD-10-CM | POA: Diagnosis not present

## 2020-12-30 DIAGNOSIS — H524 Presbyopia: Secondary | ICD-10-CM | POA: Diagnosis not present

## 2020-12-30 DIAGNOSIS — H5212 Myopia, left eye: Secondary | ICD-10-CM | POA: Diagnosis not present

## 2020-12-30 DIAGNOSIS — H5789 Other specified disorders of eye and adnexa: Secondary | ICD-10-CM | POA: Diagnosis not present

## 2020-12-30 DIAGNOSIS — H16223 Keratoconjunctivitis sicca, not specified as Sjogren's, bilateral: Secondary | ICD-10-CM | POA: Diagnosis not present

## 2020-12-30 DIAGNOSIS — H179 Unspecified corneal scar and opacity: Secondary | ICD-10-CM | POA: Diagnosis not present

## 2021-02-23 DIAGNOSIS — K1321 Leukoplakia of oral mucosa, including tongue: Secondary | ICD-10-CM | POA: Diagnosis not present

## 2021-03-02 DIAGNOSIS — R5383 Other fatigue: Secondary | ICD-10-CM | POA: Diagnosis not present

## 2021-03-02 DIAGNOSIS — Z0000 Encounter for general adult medical examination without abnormal findings: Secondary | ICD-10-CM | POA: Diagnosis not present

## 2021-03-03 NOTE — Progress Notes (Deleted)
Office Visit Note  Patient: Michelle Cunningham             Date of Birth: 27-Jan-1966           MRN: [id number]             PCP: Fanny Bien, MD Referring: Fanny Bien, MD Visit Date: 03/17/2021 Occupation: @GUAROCC @  Subjective:  No chief complaint on file.   History of Present Illness: Michelle Cunningham is a 55 y.o. female ***   Activities of Daily Living:  Patient reports morning stiffness for *** {minute/hour:19697}.   Patient {ACTIONS;DENIES/REPORTS:21021675::"Denies"} nocturnal pain.  Difficulty dressing/grooming: {ACTIONS;DENIES/REPORTS:21021675::"Denies"} Difficulty climbing stairs: {ACTIONS;DENIES/REPORTS:21021675::"Denies"} Difficulty getting out of chair: {ACTIONS;DENIES/REPORTS:21021675::"Denies"} Difficulty using hands for taps, buttons, cutlery, and/or writing: {ACTIONS;DENIES/REPORTS:21021675::"Denies"}  No Rheumatology ROS completed.   PMFS History:  Patient Active Problem List   Diagnosis Date Noted   Erosive gastritis 12/16/2020   Age-related osteoporosis without current pathological fracture 12/16/2020   Primary osteoarthritis of both feet 12/16/2020   Primary osteoarthritis of both hands 12/16/2020   Raynaud's phenomenon without gangrene 12/16/2020   Sicca complex ([place]) 12/16/2020    Past Medical History:  Diagnosis Date   Anal fissure    Diverticulosis    HLD (hyperlipidemia)    HTN (hypertension)    Internal hemorrhoids    Osteoarthritis    Osteoporosis    Tubular adenoma of colon     Family History  Problem Relation Age of Onset   Ulcerative colitis Brother    Osteoporosis Paternal Grandmother    Diabetes Paternal Grandfather    Juvenile idiopathic arthritis Daughter    Past Surgical History:  Procedure Laterality Date   transvaginal tape insertion     Social History   Social History Narrative   Not on file   Immunization History  Administered Date(s) Administered   PFIZER(Purple Top)SARS-COV-2 Vaccination  12/02/2019, 12/24/2019, 07/21/2020     Objective: Vital Signs: There were no vitals taken for this visit.   Physical Exam   Musculoskeletal Exam: ***  CDAI Exam: CDAI Score: -- Patient Global: --; Provider Global: -- Swollen: --; Tender: -- Joint Exam 03/17/2021   No joint exam has been documented for this visit   There is currently no information documented on the homunculus. Go to the Rheumatology activity and complete the homunculus joint exam.  Investigation: No additional findings.  Imaging: No results found.  Recent Labs: Lab Results  Component Value Date   WBC 5.0 11/09/2020   HGB 14.4 11/09/2020   PLT 370 11/09/2020   NA 141 11/09/2020   K 4.4 11/09/2020   CL 104 11/09/2020   CO2 30 11/09/2020   GLUCOSE 105 (H) 11/09/2020   BUN 11 11/09/2020   CREATININE 0.73 11/09/2020   BILITOT 0.5 11/09/2020   AST 20 11/09/2020   ALT 21 11/09/2020   PROT 7.2 11/09/2020   CALCIUM 9.5 11/09/2020   GFRAA 107 11/09/2020    Speciality Comments: No specialty comments available.  Procedures:  No procedures performed Allergies: Patient has no known allergies.   Assessment / Plan:     Visit Diagnoses: No diagnosis found.  Orders: No orders of the defined types were placed in this encounter.  No orders of the defined types were placed in this encounter.   Face-to-face time spent with patient was *** minutes. Greater than 50% of time was spent in counseling and coordination of care.  Follow-Up Instructions: No follow-ups on file.   Earnestine Mealing, CMA  Note - This  record has been created using [employer].  Chart creation errors have been sought, but may not always  have been located. Such creation errors do not reflect on  the standard of medical care.

## 2021-03-04 DIAGNOSIS — E782 Mixed hyperlipidemia: Secondary | ICD-10-CM | POA: Diagnosis not present

## 2021-03-04 DIAGNOSIS — E559 Vitamin D deficiency, unspecified: Secondary | ICD-10-CM | POA: Diagnosis not present

## 2021-03-04 DIAGNOSIS — M810 Age-related osteoporosis without current pathological fracture: Secondary | ICD-10-CM | POA: Diagnosis not present

## 2021-03-04 DIAGNOSIS — M545 Low back pain, unspecified: Secondary | ICD-10-CM | POA: Diagnosis not present

## 2021-03-12 ENCOUNTER — Telehealth: Payer: Self-pay | Admitting: *Deleted

## 2021-03-12 NOTE — Telephone Encounter (Signed)
It is okay to reschedule appointment until after the [place] visit unless she is having more severe and new symptoms.

## 2021-03-12 NOTE — Telephone Encounter (Signed)
Spoke with patient and rescheduled her appointment for August 09, 2021.

## 2021-03-12 NOTE — Telephone Encounter (Signed)
Patient states she has an appointment here on 03/17/2021 and was advised to follow up after she has been seen by [facility]. Patient states she is unable to see [facility] until l;ate November 2022. Patient would like to know if you would like for her to keep her appointment for 03/17/2021 or would you like for her to reschedule for later this year. Please advise.

## 2021-03-17 ENCOUNTER — Ambulatory Visit: Payer: BC Managed Care – PPO | Admitting: Rheumatology

## 2021-04-06 DIAGNOSIS — K1321 Leukoplakia of oral mucosa, including tongue: Secondary | ICD-10-CM | POA: Diagnosis not present

## 2021-04-08 ENCOUNTER — Ambulatory Visit: Payer: BC Managed Care – PPO | Admitting: Physician Assistant

## 2021-07-19 DIAGNOSIS — R55 Syncope and collapse: Secondary | ICD-10-CM | POA: Diagnosis not present

## 2021-07-19 DIAGNOSIS — M2550 Pain in unspecified joint: Secondary | ICD-10-CM | POA: Diagnosis not present

## 2021-07-19 DIAGNOSIS — K121 Other forms of stomatitis: Secondary | ICD-10-CM | POA: Diagnosis not present

## 2021-07-19 DIAGNOSIS — H04123 Dry eye syndrome of bilateral lacrimal glands: Secondary | ICD-10-CM | POA: Diagnosis not present

## 2021-07-19 DIAGNOSIS — R682 Dry mouth, unspecified: Secondary | ICD-10-CM | POA: Diagnosis not present

## 2021-07-27 NOTE — Progress Notes (Signed)
Office Visit Note  Patient: Michelle Cunningham             Date of Birth: 03/26/1966           MRN: [id number]             PCP: Fanny Bien, MD Referring: Fanny Bien, MD Visit Date: 08/09/2021 Occupation: @GUAROCC @  Subjective:  Recurrent oral ulcers.   History of Present Illness: Sanaz Scarlett is a 55 y.o. female with a history of recurrent oral ulcers, sicca symptoms and osteoarthritis.  She went to see Dr. Vernona Rieger at [facility] rheumatology for second opinion.  Patient states that he did extensive work-up and he agreed that patient does not have Sjogren's syndrome or Behcet's.  She continues to have cold hands without any digital ulcers.  She has an appointment with the [facility] dermatology this afternoon.  Activities of Daily Living:  Patient reports morning stiffness for 3 minutes.   Patient Reports nocturnal pain.  Difficulty dressing/grooming: Denies Difficulty climbing stairs: Denies Difficulty getting out of chair: Denies Difficulty using hands for taps, buttons, cutlery, and/or writing: Denies  Review of Systems  Constitutional:  Negative for fatigue.  HENT:  Positive for mouth dryness.   Eyes:  Positive for dryness.  Respiratory:  Negative for shortness of breath.   Cardiovascular:  Negative for swelling in legs/feet.  Gastrointestinal:  Negative for constipation.  Endocrine: Negative for increased urination.  Genitourinary:  Negative for difficulty urinating.  Musculoskeletal:  Positive for joint pain, joint pain, joint swelling, morning stiffness and muscle tenderness.  Skin:  Negative for rash.  Allergic/Immunologic: Negative for susceptible to infections.  Neurological:  Negative for numbness.  Hematological:  Negative for bruising/bleeding tendency.  Psychiatric/Behavioral:  Positive for sleep disturbance.    PMFS History:  Patient Active Problem List   Diagnosis Date Noted   Erosive gastritis 12/16/2020   Age-related osteoporosis without  current pathological fracture 12/16/2020   Primary osteoarthritis of both feet 12/16/2020   Primary osteoarthritis of both hands 12/16/2020   Raynaud's phenomenon without gangrene 12/16/2020   Sicca complex ([place]) 12/16/2020    Past Medical History:  Diagnosis Date   Anal fissure    Diverticulosis    HLD (hyperlipidemia)    HTN (hypertension)    Internal hemorrhoids    Osteoarthritis    Osteoporosis    Tubular adenoma of colon     Family History  Problem Relation Age of Onset   Ulcerative colitis Brother    Osteoporosis Paternal Grandmother    Diabetes Paternal Grandfather    Juvenile idiopathic arthritis Daughter    Past Surgical History:  Procedure Laterality Date   transvaginal tape insertion     Social History   Social History Narrative   Not on file   Immunization History  Administered Date(s) Administered   PFIZER(Purple Top)SARS-COV-2 Vaccination 12/02/2019, 12/24/2019, 07/21/2020     Objective: Vital Signs: BP (!) 146/78 (BP Location: Left Arm, Patient Position: Sitting, Cuff Size: Small)   Pulse 71   Resp 12   Ht 5' 6.5" (1.689 m)   Wt 128 lb 12.8 oz (58.4 kg)   BMI 20.48 kg/m    Physical Exam Vitals and nursing note reviewed.  Constitutional:      Appearance: She is well-developed.  HENT:     Head: Normocephalic and atraumatic.     Mouth/Throat:     Comments: Oral ulcer was noted over her palate. Eyes:     Conjunctiva/sclera: Conjunctivae normal.  Cardiovascular:  Rate and Rhythm: Normal rate and regular rhythm.     Heart sounds: Normal heart sounds.  Pulmonary:     Effort: Pulmonary effort is normal.     Breath sounds: Normal breath sounds.  Abdominal:     General: Bowel sounds are normal.     Palpations: Abdomen is soft.  Musculoskeletal:     Cervical back: Normal range of motion.  Lymphadenopathy:     Cervical: No cervical adenopathy.  Skin:    General: Skin is warm and dry.     Capillary Refill: Capillary refill takes less than 2  seconds.  Neurological:     Mental Status: She is alert and oriented to person, place, and time.  Psychiatric:        Behavior: Behavior normal.     Musculoskeletal Exam: C-spine thoracic and lumbar spine were in good range of motion.  Shoulder joints, elbow joints, wrist joints, MCPs PIPs and DIPs with good range of motion with no synovitis.  Hip joints, knee joints, ankles, MTPs and PIPs with good range of motion with no synovitis.  CDAI Exam: CDAI Score: -- Patient Global: --; Provider Global: -- Swollen: --; Tender: -- Joint Exam 08/09/2021   No joint exam has been documented for this visit   There is currently no information documented on the homunculus. Go to the Rheumatology activity and complete the homunculus joint exam.  Investigation: No additional findings.  Imaging: No results found.  Recent Labs: Lab Results  Component Value Date   WBC 5.0 11/09/2020   HGB 14.4 11/09/2020   PLT 370 11/09/2020   NA 141 11/09/2020   K 4.4 11/09/2020   CL 104 11/09/2020   CO2 30 11/09/2020   GLUCOSE 105 (H) 11/09/2020   BUN 11 11/09/2020   CREATININE 0.73 11/09/2020   BILITOT 0.5 11/09/2020   AST 20 11/09/2020   ALT 21 11/09/2020   PROT 7.2 11/09/2020   CALCIUM 9.5 11/09/2020   GFRAA 107 11/09/2020    Speciality Comments: No specialty comments available.  Procedures:  No procedures performed Allergies: Patient has no known allergies.   Assessment / Plan:     Visit Diagnoses: Oral ulcer - Recurrent oral ulcers x2 years.  Evaluated by dentist and periodontist.  Biopsy suspicious of lichen planus.  All autoimmune work-up negative.  She was also evaluated by Dr. Vernona Rieger at [facility].  She has an appointment with the [facility] dermatology this afternoon.  She will follow-up with the dermatologist for now.  Have advised her to contact me if she develops any new symptoms.  Sicca complex ([place]) - All serology was negative.  She does not meet the criteria for Sjogren's.  Eye redness -  she was evaluated by ophthalmologist and was diagnosed with dry eyes.    Rash - History of rash on upper extremities October 2021.  No recurrence of the rash  Raynaud's phenomenon without gangrene - History of cold fingers but not typical Raynauds symptoms.   Lateral epicondylitis, right elbow-she is off-and-on symptoms.  Primary osteoarthritis of both hands - Clinical findings are consistent with osteoarthritis.  Joint protection muscle strengthening was discussed.  Primary osteoarthritis of both feet-currently not very symptomatic.  Erosive gastritis - Followed by Dr. Silverio Decamp.  Other fatigue - History of fatigue and weight loss.  Age-related osteoporosis without current pathological fracture - On Fosamax since April 2021 by her PCP.  Family history of juvenile rheumatoid arthritis - Daughter.   Family history of ulcerative colitis - Brother.   Orders: No  orders of the defined types were placed in this encounter.  No orders of the defined types were placed in this encounter.    Follow-Up Instructions: Return in about 1 year (around 08/09/2022), or if symptoms worsen or fail to improve, for sicca , Osteoarthritis.   Bo Merino, MD  Note - This record has been created using Editor, commissioning.  Chart creation errors have been sought, but may not always  have been located. Such creation errors do not reflect on  the standard of medical care.

## 2021-08-09 ENCOUNTER — Other Ambulatory Visit: Payer: Self-pay

## 2021-08-09 ENCOUNTER — Ambulatory Visit (INDEPENDENT_AMBULATORY_CARE_PROVIDER_SITE_OTHER): Payer: BC Managed Care – PPO | Admitting: Rheumatology

## 2021-08-09 ENCOUNTER — Encounter: Payer: Self-pay | Admitting: Rheumatology

## 2021-08-09 VITALS — BP 146/78 | HR 71 | Resp 12 | Ht 66.5 in | Wt 128.8 lb

## 2021-08-09 DIAGNOSIS — M19042 Primary osteoarthritis, left hand: Secondary | ICD-10-CM

## 2021-08-09 DIAGNOSIS — I7300 Raynaud's syndrome without gangrene: Secondary | ICD-10-CM

## 2021-08-09 DIAGNOSIS — K0510 Chronic gingivitis, plaque induced: Secondary | ICD-10-CM | POA: Diagnosis not present

## 2021-08-09 DIAGNOSIS — H5789 Other specified disorders of eye and adnexa: Secondary | ICD-10-CM | POA: Diagnosis not present

## 2021-08-09 DIAGNOSIS — R21 Rash and other nonspecific skin eruption: Secondary | ICD-10-CM | POA: Diagnosis not present

## 2021-08-09 DIAGNOSIS — M3500 Sicca syndrome, unspecified: Secondary | ICD-10-CM | POA: Diagnosis not present

## 2021-08-09 DIAGNOSIS — Z8261 Family history of arthritis: Secondary | ICD-10-CM

## 2021-08-09 DIAGNOSIS — K2960 Other gastritis without bleeding: Secondary | ICD-10-CM

## 2021-08-09 DIAGNOSIS — R5383 Other fatigue: Secondary | ICD-10-CM

## 2021-08-09 DIAGNOSIS — M7711 Lateral epicondylitis, right elbow: Secondary | ICD-10-CM

## 2021-08-09 DIAGNOSIS — Z8379 Family history of other diseases of the digestive system: Secondary | ICD-10-CM

## 2021-08-09 DIAGNOSIS — M19072 Primary osteoarthritis, left ankle and foot: Secondary | ICD-10-CM

## 2021-08-09 DIAGNOSIS — M19041 Primary osteoarthritis, right hand: Secondary | ICD-10-CM

## 2021-08-09 DIAGNOSIS — M810 Age-related osteoporosis without current pathological fracture: Secondary | ICD-10-CM

## 2021-08-09 DIAGNOSIS — M19071 Primary osteoarthritis, right ankle and foot: Secondary | ICD-10-CM

## 2021-08-09 DIAGNOSIS — K121 Other forms of stomatitis: Secondary | ICD-10-CM

## 2021-10-12 ENCOUNTER — Ambulatory Visit: Payer: BC Managed Care – PPO | Admitting: Physician Assistant

## 2021-10-12 ENCOUNTER — Other Ambulatory Visit: Payer: Self-pay

## 2021-10-12 DIAGNOSIS — D1801 Hemangioma of skin and subcutaneous tissue: Secondary | ICD-10-CM | POA: Diagnosis not present

## 2021-10-12 DIAGNOSIS — L128 Other pemphigoid: Secondary | ICD-10-CM | POA: Diagnosis not present

## 2021-10-12 DIAGNOSIS — L821 Other seborrheic keratosis: Secondary | ICD-10-CM

## 2021-10-12 DIAGNOSIS — H04123 Dry eye syndrome of bilateral lacrimal glands: Secondary | ICD-10-CM | POA: Diagnosis not present

## 2021-10-12 DIAGNOSIS — L121 Cicatricial pemphigoid: Secondary | ICD-10-CM | POA: Diagnosis not present

## 2021-10-31 ENCOUNTER — Encounter: Payer: Self-pay | Admitting: Physician Assistant

## 2021-10-31 NOTE — Progress Notes (Signed)
° °  New Patient   Subjective  Michelle Cunningham is a 56 y.o. 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 Patient already had this appointment so she kept it just to see what we had to say. She is having a few symptoms like sores in mouth pain going to bathroom. Patient has seen GI and did colonoscopy and endoscopy. She has lots of ulcers. Patient also see's a rheumatologist she has osteopetrosis. The doctor at [facility] just prescribed doxy 50 bid for some of her symptoms. She also has clobetasol for the mouth sores. She was quite pleasant and really wanted to make sure that she was doing the right thing.    The following portions of the chart were reviewed this encounter and updated as appropriate:  Tobacco   Allergies   Meds   Problems   Med Hx   Surg Hx   Fam Hx       Objective  Well appearing patient in no apparent distress; mood and affect are within normal limits.  A focused examination was performed including waist up . Relevant physical exam findings are noted in the Assessment and Plan.  Red  papules of all sizes scattered.   Left Temporal Scalp Stuck-on, crusty plaque.    Assessment & Plan  Mucous membrane pemphigoid oral cavity  Continue treatment as directed by her physicians.  I assured her that she was with the best health care team for her condition. Encouraged to her stay in touch if she needs anything.   Seborrheic keratosis Left Temporal Scalp  Benign- ok to leave is stable  Hemangioma of skin  Benign- ok to leave if stable     I, Keilee Denman, PA-C, have reviewed all documentation's for this visit.  The documentation on 10/31/21 for the exam, diagnosis, procedures and orders are all accurate and complete.

## 2021-11-08 DIAGNOSIS — Z1231 Encounter for screening mammogram for malignant neoplasm of breast: Secondary | ICD-10-CM | POA: Diagnosis not present

## 2021-11-10 ENCOUNTER — Other Ambulatory Visit: Payer: Self-pay | Admitting: Family Medicine

## 2021-11-10 DIAGNOSIS — R69 Illness, unspecified: Secondary | ICD-10-CM

## 2021-11-12 ENCOUNTER — Other Ambulatory Visit: Payer: Self-pay | Admitting: Family Medicine

## 2021-11-12 DIAGNOSIS — M8580 Other specified disorders of bone density and structure, unspecified site: Secondary | ICD-10-CM

## 2021-12-21 DIAGNOSIS — Z114 Encounter for screening for human immunodeficiency virus [HIV]: Secondary | ICD-10-CM | POA: Diagnosis not present

## 2021-12-21 DIAGNOSIS — Z0000 Encounter for general adult medical examination without abnormal findings: Secondary | ICD-10-CM | POA: Diagnosis not present

## 2021-12-23 DIAGNOSIS — L121 Cicatricial pemphigoid: Secondary | ICD-10-CM | POA: Diagnosis not present

## 2021-12-23 DIAGNOSIS — Z0000 Encounter for general adult medical examination without abnormal findings: Secondary | ICD-10-CM | POA: Diagnosis not present

## 2021-12-23 DIAGNOSIS — H6121 Impacted cerumen, right ear: Secondary | ICD-10-CM | POA: Diagnosis not present

## 2021-12-27 DIAGNOSIS — L121 Cicatricial pemphigoid: Secondary | ICD-10-CM | POA: Diagnosis not present

## 2021-12-27 DIAGNOSIS — Z79899 Other long term (current) drug therapy: Secondary | ICD-10-CM | POA: Diagnosis not present

## 2021-12-27 DIAGNOSIS — L309 Dermatitis, unspecified: Secondary | ICD-10-CM | POA: Diagnosis not present

## 2022-02-03 DIAGNOSIS — U071 COVID-19: Secondary | ICD-10-CM | POA: Diagnosis not present

## 2022-02-21 DIAGNOSIS — R682 Dry mouth, unspecified: Secondary | ICD-10-CM | POA: Diagnosis not present

## 2022-02-21 DIAGNOSIS — M19042 Primary osteoarthritis, left hand: Secondary | ICD-10-CM | POA: Diagnosis not present

## 2022-02-21 DIAGNOSIS — L309 Dermatitis, unspecified: Secondary | ICD-10-CM | POA: Diagnosis not present

## 2022-02-21 DIAGNOSIS — H04123 Dry eye syndrome of bilateral lacrimal glands: Secondary | ICD-10-CM | POA: Diagnosis not present

## 2022-02-21 DIAGNOSIS — R55 Syncope and collapse: Secondary | ICD-10-CM | POA: Diagnosis not present

## 2022-02-21 DIAGNOSIS — M19041 Primary osteoarthritis, right hand: Secondary | ICD-10-CM | POA: Diagnosis not present

## 2022-02-21 DIAGNOSIS — Z79899 Other long term (current) drug therapy: Secondary | ICD-10-CM | POA: Diagnosis not present

## 2022-02-21 DIAGNOSIS — L121 Cicatricial pemphigoid: Secondary | ICD-10-CM | POA: Diagnosis not present

## 2022-03-04 DIAGNOSIS — E559 Vitamin D deficiency, unspecified: Secondary | ICD-10-CM | POA: Diagnosis not present

## 2022-03-04 DIAGNOSIS — R030 Elevated blood-pressure reading, without diagnosis of hypertension: Secondary | ICD-10-CM | POA: Diagnosis not present

## 2022-03-04 DIAGNOSIS — R739 Hyperglycemia, unspecified: Secondary | ICD-10-CM | POA: Diagnosis not present

## 2022-03-09 DIAGNOSIS — R030 Elevated blood-pressure reading, without diagnosis of hypertension: Secondary | ICD-10-CM | POA: Diagnosis not present

## 2022-03-09 DIAGNOSIS — E559 Vitamin D deficiency, unspecified: Secondary | ICD-10-CM | POA: Diagnosis not present

## 2022-03-09 DIAGNOSIS — R739 Hyperglycemia, unspecified: Secondary | ICD-10-CM | POA: Diagnosis not present

## 2022-03-09 DIAGNOSIS — I10 Essential (primary) hypertension: Secondary | ICD-10-CM | POA: Diagnosis not present

## 2022-03-31 DIAGNOSIS — L121 Cicatricial pemphigoid: Secondary | ICD-10-CM | POA: Diagnosis not present

## 2022-03-31 DIAGNOSIS — H04123 Dry eye syndrome of bilateral lacrimal glands: Secondary | ICD-10-CM | POA: Diagnosis not present

## 2022-04-27 ENCOUNTER — Ambulatory Visit
Admission: RE | Admit: 2022-04-27 | Discharge: 2022-04-27 | Disposition: A | Discharge status: Home / Self Care | Payer: BC Managed Care – PPO | Source: Ambulatory Visit | Attending: Family Medicine | Admitting: Family Medicine

## 2022-04-27 DIAGNOSIS — M810 Age-related osteoporosis without current pathological fracture: Secondary | ICD-10-CM | POA: Diagnosis not present

## 2022-04-27 DIAGNOSIS — M8580 Other specified disorders of bone density and structure, unspecified site: Secondary | ICD-10-CM

## 2022-04-27 DIAGNOSIS — M8589 Other specified disorders of bone density and structure, multiple sites: Secondary | ICD-10-CM | POA: Diagnosis not present

## 2022-04-27 DIAGNOSIS — Z780 Asymptomatic menopausal state: Secondary | ICD-10-CM | POA: Diagnosis not present

## 2022-05-05 DIAGNOSIS — L121 Cicatricial pemphigoid: Secondary | ICD-10-CM | POA: Diagnosis not present

## 2022-05-05 DIAGNOSIS — Z79899 Other long term (current) drug therapy: Secondary | ICD-10-CM | POA: Diagnosis not present

## 2022-05-12 DIAGNOSIS — M8580 Other specified disorders of bone density and structure, unspecified site: Secondary | ICD-10-CM | POA: Diagnosis not present

## 2022-05-12 DIAGNOSIS — E559 Vitamin D deficiency, unspecified: Secondary | ICD-10-CM | POA: Diagnosis not present

## 2022-05-12 DIAGNOSIS — M19049 Primary osteoarthritis, unspecified hand: Secondary | ICD-10-CM | POA: Diagnosis not present

## 2022-05-12 DIAGNOSIS — R030 Elevated blood-pressure reading, without diagnosis of hypertension: Secondary | ICD-10-CM | POA: Diagnosis not present

## 2022-08-09 ENCOUNTER — Ambulatory Visit: Payer: BC Managed Care – PPO | Admitting: Rheumatology

## 2022-09-20 NOTE — Progress Notes (Signed)
Office Visit Note  Patient: Michelle Cunningham             Date of Birth: 01/23/1966           MRN: [id number]             PCP: Fanny Bien, MD Referring: Fanny Bien, MD Visit Date: 10/04/2022 Occupation: '@GUAROCC'$ @  Subjective:  Recurrent oral ulcers  History of Present Illness: Michelle Cunningham is a 57 y.o. female with history of sicca symptoms, oral ulcers and osteoarthritis.  She was evaluated by Dr. Vernona Rieger and also Dr. Hart Carwin (dermatologist at [facility]).  She states after the dermatology evaluation she was diagnosed with mucous membrane pemphigoid.  She was placed on clobetasol 0.05% ointment, methotrexate 15 mg p.o. weekly along with folic acid.  Patient states she has not noticed much improvement on methotrexate so far.  She will have a follow-up appointment with the dermatologist.  She still have ophthalmology visit pending.  She continues to have some symptoms of gastritis and rectal itching.  She is planning to schedule an appointment with the gastroenterologist.  She continues to have pain and stiffness in her hands.  None of the other joints are painful or swollen.    Activities of Daily Living:  Patient reports morning stiffness for 5 minutes.   Patient Denies nocturnal pain.  Difficulty dressing/grooming: Denies Difficulty climbing stairs: Denies Difficulty getting out of chair: Denies Difficulty using hands for taps, buttons, cutlery, and/or writing: Reports  Review of Systems  Constitutional:  Positive for fatigue. Negative for night sweats, weight gain and weight loss.  HENT:  Positive for mouth sores. Negative for trouble swallowing, trouble swallowing, mouth dryness and nose dryness.   Eyes:  Positive for dryness. Negative for pain, redness and visual disturbance.  Respiratory: Negative.  Negative for cough, shortness of breath and difficulty breathing.   Cardiovascular: Negative.  Negative for chest pain, palpitations, hypertension, irregular  heartbeat and swelling in legs/feet.  Gastrointestinal: Negative.  Negative for blood in stool, constipation and diarrhea.  Endocrine: Negative.  Negative for increased urination.  Genitourinary: Negative.  Negative for involuntary urination and vaginal dryness.  Musculoskeletal:  Positive for joint pain, gait problem, joint pain and morning stiffness. Negative for joint swelling, myalgias, muscle weakness, muscle tenderness and myalgias.  Skin: Negative.  Negative for color change, rash, hair loss, skin tightness, ulcers and sensitivity to sunlight.  Allergic/Immunologic: Negative.  Negative for susceptible to infections.  Neurological:  Positive for dizziness. Negative for headaches, memory loss, night sweats and weakness.  Hematological: Negative.  Negative for swollen glands.  Psychiatric/Behavioral: Negative.  Negative for depressed mood and sleep disturbance. The patient is not nervous/anxious.     PMFS History:  Patient Active Problem List   Diagnosis Date Noted   Erosive gastritis 12/16/2020   Age-related osteoporosis without current pathological fracture 12/16/2020   Primary osteoarthritis of both feet 12/16/2020   Primary osteoarthritis of both hands 12/16/2020   Raynaud's phenomenon without gangrene 12/16/2020   Sicca complex ([place]) 12/16/2020    Past Medical History:  Diagnosis Date   Anal fissure    Diverticulosis    HLD (hyperlipidemia)    HTN (hypertension)    Internal hemorrhoids    Mucous membrane pemphigoid    Osteoarthritis    Osteoporosis    Tubular adenoma of colon     Family History  Problem Relation Age of Onset   Ulcerative colitis Brother    Osteoporosis Paternal Grandmother    Diabetes Paternal  Grandfather    Juvenile idiopathic arthritis Daughter    Past Surgical History:  Procedure Laterality Date   transvaginal tape insertion     Social History   Social History Narrative   Not on file   Immunization History  Administered Date(s)  Administered   PFIZER(Purple Top)SARS-COV-2 Vaccination 12/02/2019, 12/24/2019, 07/21/2020     Objective: Vital Signs: BP 124/75 (BP Location: Left Arm, Patient Position: Sitting, Cuff Size: Normal)   Pulse 61   Ht 5' 6.5" (1.689 m)   Wt 135 lb (61.2 kg)   BMI 21.46 kg/m    Physical Exam Vitals and nursing note reviewed.  Constitutional:      Appearance: She is well-developed.  HENT:     Head: Normocephalic and atraumatic.  Eyes:     Conjunctiva/sclera: Conjunctivae normal.  Cardiovascular:     Rate and Rhythm: Normal rate and regular rhythm.     Heart sounds: Normal heart sounds.  Pulmonary:     Effort: Pulmonary effort is normal.     Breath sounds: Normal breath sounds.  Abdominal:     General: Bowel sounds are normal.     Palpations: Abdomen is soft.  Musculoskeletal:     Cervical back: Normal range of motion.  Lymphadenopathy:     Cervical: No cervical adenopathy.  Skin:    General: Skin is warm and dry.     Capillary Refill: Capillary refill takes less than 2 seconds.     Comments: Oral ulcers were noted on the palate.  Neurological:     Mental Status: She is alert and oriented to person, place, and time.  Psychiatric:        Behavior: Behavior normal.      Musculoskeletal Exam: Cervical, thoracic and lumbar spine were in good range of motion.  Shoulders, elbows, wrist joints with good range of motion.  She had bilateral PIP and DIP thickening consistent with osteoarthritis.  No synovitis was noted.  Hip joints and knee joints in good range of motion.  There was no tenderness over ankles or MTPs.  CDAI Exam: CDAI Score: -- Patient Global: --; Provider Global: -- Swollen: --; Tender: -- Joint Exam 10/04/2022   No joint exam has been documented for this visit   There is currently no information documented on the homunculus. Go to the Rheumatology activity and complete the homunculus joint exam.  Investigation: No additional findings.  Imaging: No results  found.  Recent Labs: Lab Results  Component Value Date   WBC 5.0 11/09/2020   HGB 14.4 11/09/2020   PLT 370 11/09/2020   NA 141 11/09/2020   K 4.4 11/09/2020   CL 104 11/09/2020   CO2 30 11/09/2020   GLUCOSE 105 (H) 11/09/2020   BUN 11 11/09/2020   CREATININE 0.73 11/09/2020   BILITOT 0.5 11/09/2020   AST 20 11/09/2020   ALT 21 11/09/2020   PROT 7.2 11/09/2020   CALCIUM 9.5 11/09/2020   GFRAA 107 11/09/2020    Speciality Comments: No specialty comments available.  Procedures:  No procedures performed Allergies: Patient has no known allergies.   Assessment / Plan:     Visit Diagnoses: Oral ulcer - Recurrent oral ulcers x2 years.  Evaluated by dentist and periodontist.  Biopsy suspicious of lichen planus.  He was referred to [facility] for further evaluation.  Patient was evaluated by Dr. Hart Carwin who did a repeat biopsy and diagnosed patient with mucous membrane pemphigoid.  Patient has been on methotrexate 15 mg p.o. weekly along with folic acid.  She has not noticed any improvement in oral ulcers.  She currently have a palatal ulcer.  She will follow-up with Dr. Hart Carwin.  There was also question about possible Behcet's.  Her HLA B51 allele and HLA class I gene and associated with patients were both negative.  Mucous membrane pemphigoid-I reviewed her records from [facility] dermatology and [place] [facility].  She was diagnosed after the biopsy done by Dr. Hart Carwin at [facility] dermatology.  Sicca complex ([place]) - All serology was negative here.  ANA was positive at [facility] but ENA panel was completely negative.  She was also evaluated by Dr. Vernona Rieger who did not agree with the diagnosis of Sjogren's.  Eye redness - she was evaluated by ophthalmologist and was diagnosed with dry eyes.  She has been using Restasis.  Rash - History of rash on upper extremities October 2021.  No recurrence of the rash  Raynaud's phenomenon without gangrene - History of cold fingers but not typical Raynauds  symptoms.  Lateral epicondylitis, right elbow-improved.  Primary osteoarthritis of both hands - Clinical findings are consistent with osteoarthritis.  She continues to has bilateral PIP and DIP thickening.  No synovitis was noted.  Joint protection muscle strengthening was discussed.  Primary osteoarthritis of both feet-proper fitting shoe as we discussed.  Erosive gastritis - Followed by Dr. Silverio Decamp.  Patient also gives history of rectal itching and redness around her anal region.  She will schedule a follow-up appointment with her gastroenterologist for evaluation.  Other fatigue - History of fatigue and weight loss.  Age-related osteoporosis without current pathological fracture - On Fosamax since April 2021 by her PCP.  I discussed possible side effects of esophagitis from the use of alendronate.  I advised her to discuss this further with her gastroenterologist.  IV Reclast could be a good option if she is developing GI symptoms related to alendronate.  Family history of juvenile rheumatoid arthritis - Daughter.  Family history of ulcerative colitis - Brother.  Orders: No orders of the defined types were placed in this encounter.  No orders of the defined types were placed in this encounter.    Follow-Up Instructions: Return in about 1 year (around 10/05/2023), or if symptoms worsen or fail to improve, for pemphegoid, Osteoarthritis.   Bo Merino, MD  Note - This record has been created using Editor, commissioning.  Chart creation errors have been sought, but may not always  have been located. Such creation errors do not reflect on  the standard of medical care.

## 2022-10-03 DIAGNOSIS — Z79899 Other long term (current) drug therapy: Secondary | ICD-10-CM | POA: Diagnosis not present

## 2022-10-03 DIAGNOSIS — L121 Cicatricial pemphigoid: Secondary | ICD-10-CM | POA: Diagnosis not present

## 2022-10-04 ENCOUNTER — Encounter: Payer: Self-pay | Admitting: Rheumatology

## 2022-10-04 ENCOUNTER — Ambulatory Visit: Payer: BC Managed Care – PPO | Attending: Rheumatology | Admitting: Rheumatology

## 2022-10-04 VITALS — BP 124/75 | HR 61 | Ht 66.5 in | Wt 135.0 lb

## 2022-10-04 DIAGNOSIS — M19042 Primary osteoarthritis, left hand: Secondary | ICD-10-CM

## 2022-10-04 DIAGNOSIS — M810 Age-related osteoporosis without current pathological fracture: Secondary | ICD-10-CM

## 2022-10-04 DIAGNOSIS — L121 Cicatricial pemphigoid: Secondary | ICD-10-CM

## 2022-10-04 DIAGNOSIS — H5789 Other specified disorders of eye and adnexa: Secondary | ICD-10-CM

## 2022-10-04 DIAGNOSIS — M19041 Primary osteoarthritis, right hand: Secondary | ICD-10-CM

## 2022-10-04 DIAGNOSIS — Z8261 Family history of arthritis: Secondary | ICD-10-CM

## 2022-10-04 DIAGNOSIS — M3500 Sicca syndrome, unspecified: Secondary | ICD-10-CM

## 2022-10-04 DIAGNOSIS — I7300 Raynaud's syndrome without gangrene: Secondary | ICD-10-CM

## 2022-10-04 DIAGNOSIS — M19071 Primary osteoarthritis, right ankle and foot: Secondary | ICD-10-CM

## 2022-10-04 DIAGNOSIS — K121 Other forms of stomatitis: Secondary | ICD-10-CM

## 2022-10-04 DIAGNOSIS — R5383 Other fatigue: Secondary | ICD-10-CM

## 2022-10-04 DIAGNOSIS — K2960 Other gastritis without bleeding: Secondary | ICD-10-CM

## 2022-10-04 DIAGNOSIS — R21 Rash and other nonspecific skin eruption: Secondary | ICD-10-CM

## 2022-10-04 DIAGNOSIS — M19072 Primary osteoarthritis, left ankle and foot: Secondary | ICD-10-CM

## 2022-10-04 DIAGNOSIS — M7711 Lateral epicondylitis, right elbow: Secondary | ICD-10-CM

## 2022-10-04 DIAGNOSIS — Z8379 Family history of other diseases of the digestive system: Secondary | ICD-10-CM

## 2022-10-13 ENCOUNTER — Ambulatory Visit: Payer: BC Managed Care – PPO | Admitting: Physician Assistant

## 2022-12-21 DIAGNOSIS — E559 Vitamin D deficiency, unspecified: Secondary | ICD-10-CM | POA: Diagnosis not present

## 2022-12-21 DIAGNOSIS — Z114 Encounter for screening for human immunodeficiency virus [HIV]: Secondary | ICD-10-CM | POA: Diagnosis not present

## 2022-12-21 DIAGNOSIS — Z0000 Encounter for general adult medical examination without abnormal findings: Secondary | ICD-10-CM | POA: Diagnosis not present

## 2022-12-21 DIAGNOSIS — Z13220 Encounter for screening for lipoid disorders: Secondary | ICD-10-CM | POA: Diagnosis not present

## 2023-01-02 DIAGNOSIS — M350C Sjogren syndrome with dental involvement: Secondary | ICD-10-CM | POA: Diagnosis not present

## 2023-01-02 DIAGNOSIS — E559 Vitamin D deficiency, unspecified: Secondary | ICD-10-CM | POA: Diagnosis not present

## 2023-01-02 DIAGNOSIS — R030 Elevated blood-pressure reading, without diagnosis of hypertension: Secondary | ICD-10-CM | POA: Diagnosis not present

## 2023-01-02 DIAGNOSIS — L121 Cicatricial pemphigoid: Secondary | ICD-10-CM | POA: Diagnosis not present

## 2023-02-06 DIAGNOSIS — L121 Cicatricial pemphigoid: Secondary | ICD-10-CM | POA: Diagnosis not present

## 2023-02-06 DIAGNOSIS — Z79899 Other long term (current) drug therapy: Secondary | ICD-10-CM | POA: Diagnosis not present

## 2023-02-06 DIAGNOSIS — B370 Candidal stomatitis: Secondary | ICD-10-CM | POA: Diagnosis not present

## 2023-02-07 DIAGNOSIS — Z0000 Encounter for general adult medical examination without abnormal findings: Secondary | ICD-10-CM | POA: Diagnosis not present

## 2023-04-12 DIAGNOSIS — L121 Cicatricial pemphigoid: Secondary | ICD-10-CM | POA: Diagnosis not present

## 2023-05-08 DIAGNOSIS — L121 Cicatricial pemphigoid: Secondary | ICD-10-CM | POA: Diagnosis not present

## 2023-05-08 DIAGNOSIS — H04123 Dry eye syndrome of bilateral lacrimal glands: Secondary | ICD-10-CM | POA: Diagnosis not present

## 2023-06-08 DIAGNOSIS — L121 Cicatricial pemphigoid: Secondary | ICD-10-CM | POA: Diagnosis not present

## 2023-06-08 DIAGNOSIS — Z79899 Other long term (current) drug therapy: Secondary | ICD-10-CM | POA: Diagnosis not present

## 2023-06-08 DIAGNOSIS — Z79631 Long term (current) use of antimetabolite agent: Secondary | ICD-10-CM | POA: Diagnosis not present

## 2023-06-08 DIAGNOSIS — L129 Pemphigoid, unspecified: Secondary | ICD-10-CM | POA: Diagnosis not present

## 2023-07-31 DIAGNOSIS — R5383 Other fatigue: Secondary | ICD-10-CM | POA: Diagnosis not present

## 2023-07-31 DIAGNOSIS — E559 Vitamin D deficiency, unspecified: Secondary | ICD-10-CM | POA: Diagnosis not present

## 2023-08-03 DIAGNOSIS — I10 Essential (primary) hypertension: Secondary | ICD-10-CM | POA: Diagnosis not present

## 2023-08-03 DIAGNOSIS — E782 Mixed hyperlipidemia: Secondary | ICD-10-CM | POA: Diagnosis not present

## 2023-08-03 DIAGNOSIS — E559 Vitamin D deficiency, unspecified: Secondary | ICD-10-CM | POA: Diagnosis not present

## 2023-08-03 DIAGNOSIS — Z789 Other specified health status: Secondary | ICD-10-CM | POA: Diagnosis not present

## 2023-09-21 NOTE — Progress Notes (Signed)
 Office Visit Note  Patient: Michelle Cunningham             Date of Birth: 1965-10-06           MRN: [id number]             PCP: Waylan Almarie SAUNDERS, MD Referring: Waylan Almarie SAUNDERS, MD Visit Date: 10/05/2023 Occupation: @GUAROCC @  Subjective:  Pain in both wrists  History of Present Illness: Michelle Cunningham is a 58 y.o. female returns today after last visit on August 09, 2021.  At the time she was seen for recurrent oral ulcers, sicca symptoms and osteoarthritis.  She was also evaluated by Dr. Bevely at [facility] rheumatology who agreed that patient did not have Sjogren's or Behcet's.  She was evaluated by [facility] dermatology and had been under care of Dr. Mabel.  She was diagnosed with mucous membranous pemphigoid she had been on the combination of methotrexate, clobetasol and chlorhexidine regimen.  She also had screening for malignancy due to strong association with MMP.  Her last colonoscopy was 3 years ago which showed benign polyps.  Using Restasis for dry eyes.  Patient states that last December she started experiencing some discomfort in her bilateral wrist joints which responded to topical agents.  She still have minimal discomfort in her wrist joints.  States she noticed puffiness over the volar aspect of her wrists during the episode which resolved.  She states she started taking rowing lessons and was wondering if it was related to that.  He denies having any Raynauds symptoms.  Right elbow tendinitis has resolved.  Her knee joints are doing better.  She had no recurrence of rash.    Activities of Daily Living:  Patient reports morning stiffness for 1 minute.   Patient Denies nocturnal pain.  Difficulty dressing/grooming: Denies Difficulty climbing stairs: Denies Difficulty getting out of chair: Denies Difficulty using hands for taps, buttons, cutlery, and/or writing: Reports  Review of Systems  Constitutional:  Negative for fatigue.  HENT:  Positive for mouth sores.  Negative for mouth dryness.   Eyes:  Positive for dryness.  Respiratory:  Negative for shortness of breath.   Cardiovascular:  Negative for chest pain and palpitations.  Gastrointestinal:  Negative for blood in stool, constipation and diarrhea.  Endocrine: Negative for increased urination.  Genitourinary:  Negative for involuntary urination.  Musculoskeletal:  Positive for joint pain, joint pain and morning stiffness. Negative for gait problem, joint swelling, myalgias, muscle weakness, muscle tenderness and myalgias.  Skin:  Positive for color change and hair loss. Negative for rash and sensitivity to sunlight.  Allergic/Immunologic: Negative for susceptible to infections.  Neurological:  Positive for numbness. Negative for dizziness and headaches.  Hematological:  Negative for swollen glands.  Psychiatric/Behavioral:  Positive for sleep disturbance. Negative for depressed mood. The patient is not nervous/anxious.     PMFS History:  Patient Active Problem List   Diagnosis Date Noted   Mucous membrane pemphigoid 10/05/2023   Vitamin D deficiency 10/05/2023   Erosive gastritis 12/16/2020   Age-related osteoporosis without current pathological fracture 12/16/2020   Primary osteoarthritis of both feet 12/16/2020   Primary osteoarthritis of both hands 12/16/2020   Raynaud's phenomenon without gangrene 12/16/2020   Sicca complex ([facility]) 12/16/2020    Past Medical History:  Diagnosis Date   Anal fissure    Diverticulosis    HLD (hyperlipidemia)    HTN (hypertension)    Internal hemorrhoids    Mucous membrane pemphigoid    Osteoarthritis  Osteoporosis    Tubular adenoma of colon     Family History  Problem Relation Age of Onset   Ulcerative colitis Brother    Osteoporosis Paternal Grandmother    Diabetes Paternal Grandfather    Juvenile idiopathic arthritis Daughter    Past Surgical History:  Procedure Laterality Date   transvaginal tape insertion     Social History    Social History Narrative   Not on file   Immunization History  Administered Date(s) Administered   PFIZER(Purple Top)SARS-COV-2 Vaccination 12/02/2019, 12/24/2019, 07/21/2020     Objective: Vital Signs: BP 127/77 (BP Location: Left Arm, Patient Position: Sitting, Cuff Size: Normal)   Pulse 60   Resp 13   Ht 5' 6.14 (1.68 m)   Wt 134 lb 6.4 oz (61 kg)   BMI 21.60 kg/m    Physical Exam Vitals and nursing note reviewed.  Constitutional:      Appearance: She is well-developed.  HENT:     Head: Normocephalic and atraumatic.  Eyes:     Conjunctiva/sclera: Conjunctivae normal.  Cardiovascular:     Rate and Rhythm: Normal rate and regular rhythm.     Heart sounds: Normal heart sounds.  Pulmonary:     Effort: Pulmonary effort is normal.     Breath sounds: Normal breath sounds.  Abdominal:     General: Bowel sounds are normal.     Palpations: Abdomen is soft.  Musculoskeletal:     Cervical back: Normal range of motion.  Lymphadenopathy:     Cervical: No cervical adenopathy.  Skin:    General: Skin is warm and dry.     Capillary Refill: Capillary refill takes 2 to 3 seconds.  Neurological:     Mental Status: She is alert and oriented to person, place, and time.  Psychiatric:        Behavior: Behavior normal.      Musculoskeletal Exam: Cervical, thoracic and lumbar spine 1 good range of motion.  Shoulders, elbows, wrist joints, MCPs PIPs and DIPs in good range of motion.  She had bilateral PIP and DIP thickening with no synovitis.  No synovitis was noted over wrist joints.  Hip joints and knee joints in good range of motion without any warmth swelling or effusion.  There was no tenderness over ankles or MTPs.  CDAI Exam: CDAI Score: -- Patient Global: --; Provider Global: -- Swollen: --; Tender: -- Joint Exam 10/05/2023   No joint exam has been documented for this visit   There is currently no information documented on the homunculus. Go to the Rheumatology activity  and complete the homunculus joint exam.  Investigation: No additional findings.  Imaging: No results found.  Recent Labs: Lab Results  Component Value Date   WBC 5.0 11/09/2020   HGB 14.4 11/09/2020   PLT 370 11/09/2020   NA 141 11/09/2020   K 4.4 11/09/2020   CL 104 11/09/2020   CO2 30 11/09/2020   GLUCOSE 105 (H) 11/09/2020   BUN 11 11/09/2020   CREATININE 0.73 11/09/2020   BILITOT 0.5 11/09/2020   AST 20 11/09/2020   ALT 21 11/09/2020   PROT 7.2 11/09/2020   CALCIUM 9.5 11/09/2020   GFRAA 107 11/09/2020   June 08, 2023 CBC and CMP were normal.  Speciality Comments: No specialty comments available.  Procedures:  No procedures performed Allergies: Patient has no known allergies.   Assessment / Plan:     Visit Diagnoses: Mucous membrane pemphigoid - Followed by Dr. Mabel at [facility] dermatology.  Patient  has infrequent lesions now.  1 oral lesion was noted on the right side of her palate.  She is on methotrexate 15 mg p.o. weekly along with folic acid 1 mg p.o. daily prescribed by the dermatologist.  High risk medication use - On methotrexate 15 mg p.o. weekly, folic acid 1 mg p.o. daily, topical clobetasol and chlorhexidine by her dermatologist  Sicca complex ([facility]) - All serology negative.  Patient was also evaluated by Dr. Bevely.  Patient states she has dry eyes for which she has been using Restasis.  Raynaud's phenomenon without gangrene-she continues to have mild Raynaud's symptoms.  She decreased capillary refill without any nailbed capillary changes or sclerodactyly.  Keeping core temperature warm and warm clothing was discussed.  Pain in both wrists-patient had increased wrist joint pain during December.  Patient states she was taking some rowing lessons.  She noticed fullness over the volar aspect of her wrist which improved after using Voltaren gel.  I did not see any synovitis or tenosynovitis on the examination.  She has been having some stiffness in her  hands but no synovitis was noted.  I offered x-rays of bilateral hands and wrist which she declined.  I advised her to contact me if she develops any increased swelling.  Will observe for now.  Primary osteoarthritis of both hands-she has bilateral PIP and DIP thickening.  Joint protection muscle strengthening was discussed.  A handout on hand exercises was given.  Lateral epicondylitis, right elbow-improved.  Primary osteoarthritis of both feet-she has been using proper fitting shoes which has been helpful.  Erosive gastritis-she has not had any recent episodes.  Age-related osteoporosis without current pathological fracture - She is on Fosamax 70 mg p.o. weekly.  She takes vitamin D.  Need for regular exercise was discussed.  Vitamin D deficiency-patient states her vitamin D was low and she has been taking vitamin D supplement 5000 units daily.  Family history of juvenile rheumatoid arthritis-daughter  Family history of ulcerative colitis-Brother  Orders: No orders of the defined types were placed in this encounter.  No orders of the defined types were placed in this encounter.    Follow-Up Instructions: Return in about 1 year (around 10/04/2024) for MMP, OA.   Maya Nash, MD  Note - This record has been created using Animal nutritionist.  Chart creation errors have been sought, but may not always  have been located. Such creation errors do not reflect on  the standard of medical care.

## 2023-10-05 ENCOUNTER — Encounter: Payer: Self-pay | Admitting: Rheumatology

## 2023-10-05 ENCOUNTER — Ambulatory Visit: Payer: BC Managed Care – PPO | Attending: Rheumatology | Admitting: Rheumatology

## 2023-10-05 VITALS — BP 127/77 | HR 60 | Resp 13 | Ht 66.14 in | Wt 134.4 lb

## 2023-10-05 DIAGNOSIS — L121 Cicatricial pemphigoid: Secondary | ICD-10-CM | POA: Diagnosis not present

## 2023-10-05 DIAGNOSIS — M19041 Primary osteoarthritis, right hand: Secondary | ICD-10-CM

## 2023-10-05 DIAGNOSIS — Z8379 Family history of other diseases of the digestive system: Secondary | ICD-10-CM

## 2023-10-05 DIAGNOSIS — M810 Age-related osteoporosis without current pathological fracture: Secondary | ICD-10-CM

## 2023-10-05 DIAGNOSIS — I7300 Raynaud's syndrome without gangrene: Secondary | ICD-10-CM

## 2023-10-05 DIAGNOSIS — M3500 Sicca syndrome, unspecified: Secondary | ICD-10-CM

## 2023-10-05 DIAGNOSIS — Z79899 Other long term (current) drug therapy: Secondary | ICD-10-CM

## 2023-10-05 DIAGNOSIS — Z8261 Family history of arthritis: Secondary | ICD-10-CM

## 2023-10-05 DIAGNOSIS — M7711 Lateral epicondylitis, right elbow: Secondary | ICD-10-CM

## 2023-10-05 DIAGNOSIS — M19042 Primary osteoarthritis, left hand: Secondary | ICD-10-CM

## 2023-10-05 DIAGNOSIS — M25531 Pain in right wrist: Secondary | ICD-10-CM

## 2023-10-05 DIAGNOSIS — E559 Vitamin D deficiency, unspecified: Secondary | ICD-10-CM

## 2023-10-05 DIAGNOSIS — K2960 Other gastritis without bleeding: Secondary | ICD-10-CM

## 2023-10-05 DIAGNOSIS — M19072 Primary osteoarthritis, left ankle and foot: Secondary | ICD-10-CM

## 2023-10-05 DIAGNOSIS — R21 Rash and other nonspecific skin eruption: Secondary | ICD-10-CM

## 2023-10-05 DIAGNOSIS — M25532 Pain in left wrist: Secondary | ICD-10-CM

## 2023-10-05 DIAGNOSIS — M19071 Primary osteoarthritis, right ankle and foot: Secondary | ICD-10-CM

## 2023-10-05 NOTE — Patient Instructions (Signed)

## 2023-10-25 DIAGNOSIS — R5383 Other fatigue: Secondary | ICD-10-CM | POA: Diagnosis not present

## 2023-10-25 DIAGNOSIS — E559 Vitamin D deficiency, unspecified: Secondary | ICD-10-CM | POA: Diagnosis not present

## 2023-10-30 ENCOUNTER — Other Ambulatory Visit: Payer: Self-pay | Admitting: Family Medicine

## 2023-10-30 DIAGNOSIS — M778 Other enthesopathies, not elsewhere classified: Secondary | ICD-10-CM | POA: Diagnosis not present

## 2023-10-30 DIAGNOSIS — M810 Age-related osteoporosis without current pathological fracture: Secondary | ICD-10-CM | POA: Diagnosis not present

## 2023-10-30 DIAGNOSIS — L121 Cicatricial pemphigoid: Secondary | ICD-10-CM | POA: Diagnosis not present

## 2023-10-30 DIAGNOSIS — E559 Vitamin D deficiency, unspecified: Secondary | ICD-10-CM | POA: Diagnosis not present

## 2023-11-24 DIAGNOSIS — Z1231 Encounter for screening mammogram for malignant neoplasm of breast: Secondary | ICD-10-CM | POA: Diagnosis not present

## 2023-12-18 DIAGNOSIS — Z79899 Other long term (current) drug therapy: Secondary | ICD-10-CM | POA: Diagnosis not present

## 2023-12-18 DIAGNOSIS — L121 Cicatricial pemphigoid: Secondary | ICD-10-CM | POA: Diagnosis not present

## 2024-02-05 DIAGNOSIS — Z0000 Encounter for general adult medical examination without abnormal findings: Secondary | ICD-10-CM | POA: Diagnosis not present

## 2024-02-05 DIAGNOSIS — Z13220 Encounter for screening for lipoid disorders: Secondary | ICD-10-CM | POA: Diagnosis not present

## 2024-02-12 DIAGNOSIS — Z0000 Encounter for general adult medical examination without abnormal findings: Secondary | ICD-10-CM | POA: Diagnosis not present

## 2024-04-22 DIAGNOSIS — B354 Tinea corporis: Secondary | ICD-10-CM | POA: Diagnosis not present

## 2024-04-22 DIAGNOSIS — Z79899 Other long term (current) drug therapy: Secondary | ICD-10-CM | POA: Diagnosis not present

## 2024-04-22 DIAGNOSIS — L121 Cicatricial pemphigoid: Secondary | ICD-10-CM | POA: Diagnosis not present

## 2024-05-03 ENCOUNTER — Encounter: Payer: Self-pay | Admitting: Family Medicine

## 2024-05-13 ENCOUNTER — Encounter: Payer: Self-pay | Admitting: Gastroenterology

## 2024-05-15 DIAGNOSIS — Z7185 Encounter for immunization safety counseling: Secondary | ICD-10-CM | POA: Diagnosis not present

## 2024-05-15 DIAGNOSIS — Z23 Encounter for immunization: Secondary | ICD-10-CM | POA: Diagnosis not present

## 2024-05-29 DIAGNOSIS — L121 Cicatricial pemphigoid: Secondary | ICD-10-CM | POA: Diagnosis not present

## 2024-05-29 DIAGNOSIS — H04123 Dry eye syndrome of bilateral lacrimal glands: Secondary | ICD-10-CM | POA: Diagnosis not present

## 2024-06-19 ENCOUNTER — Ambulatory Visit (HOSPITAL_BASED_OUTPATIENT_CLINIC_OR_DEPARTMENT_OTHER)
Admission: RE | Admit: 2024-06-19 | Discharge: 2024-06-19 | Disposition: A | Discharge status: Home / Self Care | Source: Ambulatory Visit | Attending: Family Medicine | Admitting: Family Medicine

## 2024-06-19 DIAGNOSIS — Z780 Asymptomatic menopausal state: Secondary | ICD-10-CM | POA: Diagnosis not present

## 2024-06-19 DIAGNOSIS — M810 Age-related osteoporosis without current pathological fracture: Secondary | ICD-10-CM | POA: Insufficient documentation

## 2024-06-24 ENCOUNTER — Ambulatory Visit: Admitting: *Deleted

## 2024-06-24 ENCOUNTER — Encounter: Payer: Self-pay | Admitting: Gastroenterology

## 2024-06-24 VITALS — Ht 66.0 in | Wt 132.0 lb

## 2024-06-24 DIAGNOSIS — Z1211 Encounter for screening for malignant neoplasm of colon: Secondary | ICD-10-CM

## 2024-06-24 MED ORDER — SUTAB 1479-225-188 MG PO TABS: ORAL_TABLET | ORAL | 0 refills | Status: DC

## 2024-06-24 NOTE — Progress Notes (Signed)
 Pt's name and DOB verified at the beginning of the pre-visit with 2 identifiers  Pt denies any difficulty with ambulating,sitting, laying down or rolling side to side  Pt has no issues moving head neck or swallowing  No egg or soy allergy known to patient   No issues known to pt with past sedation  No FH of Malignant Hyperthermia  Pt is not on home 02   Pt is not on blood thinners   Pt denies issues with constipation   Pt has frequent issues with constipation RN instructed pt to use Miralax per bottles instructions a week before prep days. Pt states they will  Pt is not on dialysis  Pt denise any abnormal heart rhythms   Pt denies any upcoming cardiac testing  Patient's chart reviewed by Norleen Schillings CNRA prior to pre-visit and patient appropriate for the [facility].  Pre-visit completed and red dot placed by patient's name on their procedure day (on provider's schedule).    Visit in person  Pt states weight is 132 lb   Pt given  both [facility] main # and MD on call # prior to instructions.  Informed pt to come in at the time discussed and is shown on PV instructions.  Pt instructed to use Singlecare.com or GoodRx for a price reduction on prep  Instructed pt where to find PV instructions in My Ch. Copy of instructions given to pt Instructed pt on all aspects of written instructions including med holds clothing to wear and foods to eat and not eat as well as after procedure legal restrictions and to call MD on call if needed.. Pt states understanding. Instructed pt to review instructions again prior to procedure and call main # given if has any questions or any issues. Pt states they will.

## 2024-06-26 ENCOUNTER — Other Ambulatory Visit

## 2024-06-28 ENCOUNTER — Encounter

## 2024-07-01 DIAGNOSIS — M810 Age-related osteoporosis without current pathological fracture: Secondary | ICD-10-CM | POA: Diagnosis not present

## 2024-07-01 DIAGNOSIS — R030 Elevated blood-pressure reading, without diagnosis of hypertension: Secondary | ICD-10-CM | POA: Diagnosis not present

## 2024-07-01 DIAGNOSIS — Z780 Asymptomatic menopausal state: Secondary | ICD-10-CM | POA: Diagnosis not present

## 2024-07-01 DIAGNOSIS — E559 Vitamin D deficiency, unspecified: Secondary | ICD-10-CM | POA: Diagnosis not present

## 2024-07-08 DIAGNOSIS — Z7185 Encounter for immunization safety counseling: Secondary | ICD-10-CM | POA: Diagnosis not present

## 2024-07-08 DIAGNOSIS — Z23 Encounter for immunization: Secondary | ICD-10-CM | POA: Diagnosis not present

## 2024-07-12 ENCOUNTER — Ambulatory Visit (AMBULATORY_SURGERY_CENTER): Admitting: Gastroenterology

## 2024-07-12 ENCOUNTER — Encounter: Payer: Self-pay | Admitting: Gastroenterology

## 2024-07-12 VITALS — BP 120/72 | HR 55 | Temp 97.9°F | Resp 16 | Ht 66.0 in | Wt 130.0 lb

## 2024-07-12 DIAGNOSIS — D124 Benign neoplasm of descending colon: Secondary | ICD-10-CM

## 2024-07-12 DIAGNOSIS — D122 Benign neoplasm of ascending colon: Secondary | ICD-10-CM | POA: Diagnosis not present

## 2024-07-12 DIAGNOSIS — K635 Polyp of colon: Secondary | ICD-10-CM

## 2024-07-12 DIAGNOSIS — K5730 Diverticulosis of large intestine without perforation or abscess without bleeding: Secondary | ICD-10-CM | POA: Diagnosis not present

## 2024-07-12 DIAGNOSIS — Z1211 Encounter for screening for malignant neoplasm of colon: Secondary | ICD-10-CM | POA: Diagnosis not present

## 2024-07-12 DIAGNOSIS — K644 Residual hemorrhoidal skin tags: Secondary | ICD-10-CM | POA: Diagnosis not present

## 2024-07-12 DIAGNOSIS — Z860101 Personal history of adenomatous and serrated colon polyps: Secondary | ICD-10-CM

## 2024-07-12 DIAGNOSIS — K648 Other hemorrhoids: Secondary | ICD-10-CM | POA: Diagnosis not present

## 2024-07-12 MED ORDER — SODIUM CHLORIDE 0.9 % IV SOLN: 500.0000 mL | Freq: Once | INTRAVENOUS | Status: DC

## 2024-07-12 NOTE — Progress Notes (Signed)
 Report to PACU, RN, vss, BBS= Clear.

## 2024-07-12 NOTE — Progress Notes (Signed)
 [facility] History and Physical   Primary Care Physician:  Waylan Almarie SAUNDERS, MD   Reason for Procedure:  History of adenomatous colon polyps  Plan:    Surveillance colonoscopy with possible interventions as needed     HPI: Michelle Cunningham is a very pleasant 58 y.o. female here for surveillance colonoscopy. Denies any nausea, vomiting, abdominal pain, melena or bright red blood per rectum  The risks and benefits as well as alternatives of endoscopic procedure(s) have been discussed and reviewed.  The patient was provided an opportunity to ask questions and all were answered. The patient agreed with the plan and demonstrated an understanding of the instructions.   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 Authorizing Provider  alendronate (FOSAMAX) 70 MG tablet Take 70 mg by mouth once a week. 03/16/20  Yes [provider]  Cholecalciferol (VITAMIN D-3) 125 MCG (5000 UT) TABS Take 1 tablet by mouth daily.   Yes [provider]  cycloSPORINE (RESTASIS) 0.05 % ophthalmic emulsion Place 1 drop into both eyes 2 (two) times daily.   Yes [provider]  folic acid (FOLVITE) 1 MG tablet Take 3 mg by mouth daily.   Yes [provider]  methotrexate (RHEUMATREX) 2.5 MG tablet Take 15 mg by mouth once a week.   Yes [provider]  clobetasol ointment (TEMOVATE) 0.05 % Apply 2 times a day to affected areas.  Stop when smooth. Do not apply to face or skin folds.  08/09/21   [provider]  cycloSPORINE (RESTASIS) 0.05 % ophthalmic emulsion Apply to eye. Patient not taking: Reported on 06/24/2024    [provider]  dexamethasone 0.5 MG/5ML elixir RINSE WITH 15ml 4 TIMES DAILY FOR 3 minutes THEN EXPECTORATE. No Food/Drink FOR 20 MINUTES AFTER 05/06/21   [provider]  diclofenac Sodium (VOLTAREN) 1 % GEL Apply 2 g topically 4 (four) times daily. 06/17/22   [provider]  fluorometholone (FML) 0.1 % ophthalmic ointment Place 1 application into both eyes in the morning, at noon, and at bedtime. Patient not taking: Reported on 10/05/2023    [provider]  ketoconazole (NIZORAL) 2 % cream Apply topically as needed. 04/22/24 04/22/25  [provider]    Current Outpatient Medications  Medication Sig Dispense Refill   alendronate (FOSAMAX) 70 MG tablet Take 70 mg by mouth once a week.     Cholecalciferol (VITAMIN D-3) 125 MCG (5000 UT) TABS Take 1 tablet by mouth daily.     cycloSPORINE (RESTASIS) 0.05 % ophthalmic emulsion Place 1 drop into both eyes 2 (two) times daily.     folic acid (FOLVITE) 1 MG tablet Take 3 mg by mouth daily.     methotrexate (RHEUMATREX) 2.5 MG tablet Take 15 mg by mouth once a week.     clobetasol ointment (TEMOVATE) 0.05 % Apply 2 times a day to affected areas.  Stop when smooth.  Do not apply to face or skin folds.     cycloSPORINE (RESTASIS) 0.05 % ophthalmic emulsion Apply to eye. (Patient not taking: Reported on 06/24/2024)     dexamethasone 0.5 MG/5ML elixir RINSE WITH 15ml 4 TIMES DAILY FOR 3 minutes THEN EXPECTORATE. No Food/Drink FOR 20 MINUTES AFTER     diclofenac Sodium (VOLTAREN) 1 % GEL Apply 2 g topically 4 (four) times daily.     fluorometholone (FML) 0.1 % ophthalmic ointment Place 1 application into both eyes in the morning, at noon, and at bedtime. (Patient not taking: Reported on 10/05/2023)     ketoconazole (NIZORAL) 2 % cream Apply topically as needed.      Current Facility-Administered Medications  Medication Dose Route Frequency Provider Last Rate Last Admin   0.9 %  sodium chloride  infusion  500 mL Intravenous Once Raquel Sayres V, MD        Allergies as of 07/12/2024   (No Known Allergies)    Family History  Problem Relation Age of Onset   Ulcerative colitis Brother    Osteoporosis Paternal Grandmother    Diabetes Paternal Grandfather    Juvenile idiopathic arthritis Daughter    Colon cancer Neg Hx    Colon polyps Neg Hx    Esophageal cancer Neg Hx    Stomach cancer Neg Hx    Rectal cancer Neg Hx     Social History   Socioeconomic History   Marital status: Married    Spouse name: Not on file   Number of children: 1   Years of education: Not on file   Highest education level: Not on file  Occupational History   Not on file  Tobacco Use   Smoking status: Never    Passive exposure: Never   Smokeless tobacco: Never  Vaping Use   Vaping status: Never Used  Substance and Sexual Activity   Alcohol use: Yes    Comment: 1-2 daily   Drug use: Never   Sexual activity: Not on file  Other Topics Concern   Not on file  Social History Narrative   Not on file   Social Drivers of Health   Financial Resource Strain: Not on file  Food Insecurity: No Food Insecurity (02/21/2022)   Received from [facility]   Hunger Vital Sign    Within the past 12 months, you worried that your food would run out before you got the money to buy more.: Never true    Within the past 12 months, the food you bought just didn't last and you didn't have money to get more.: Never true  Transportation Needs: No Transportation Needs (02/21/2022)   Received from [facility] - Transportation    In the past 12 months, has lack of transportation kept you from medical appointments or from getting medications?: No    Lack of Transportation (Non-Medical): No  Physical Activity: Not on file  Stress: Not on  file  Social Connections: Not on file  Intimate Partner Violence: Not on file    Review of Systems:  All other review of systems negative except as mentioned in the HPI.  Physical Exam: Vital signs in last 24 hours: BP 126/79   Pulse 70   Temp 97.9 F (36.6 C)   Ht 5' 6 (1.676 m)   Wt 130 lb (59 kg)   SpO2 99%   BMI 20.98 kg/m  General:   Alert, NAD Lungs:  Clear .   Heart:  Regular  rate and rhythm Abdomen:  Soft, nontender and nondistended. Neuro/Psych:  Alert and cooperative. Normal mood and affect. A and O x 3  Reviewed labs, radiology imaging, old records and pertinent past GI work up  Patient is appropriate for planned procedure(s) and anesthesia in an ambulatory setting   K. Veena Sharisa Toves , MD [phone]

## 2024-07-12 NOTE — Progress Notes (Signed)
 Pt's states no medical or surgical changes since previsit or office visit.

## 2024-07-12 NOTE — Progress Notes (Signed)
 Called to room to assist during endoscopic procedure.  Patient ID and intended procedure confirmed with present staff. Received instructions for my participation in the procedure from the performing physician.

## 2024-07-12 NOTE — Patient Instructions (Addendum)
 Handouts provided on polyps, diverticulosis and hemorrhoids.  Resume previous diet.  Continue present medications.  Await pathology results.  Repeat colonoscopy in 3-5 years based for surveillance based on pathology results.   YOU HAD AN ENDOSCOPIC PROCEDURE TODAY AT THE [facility]:   Refer to the procedure report that was given to you for any specific questions about what was found during the examination.  If the procedure report does not answer your questions, please call your gastroenterologist to clarify.  If you requested that your care partner not be given the details of your procedure findings, then the procedure report has been included in a sealed envelope for you to review at your convenience later.  YOU SHOULD EXPECT: Some feelings of bloating in the abdomen. Passage of more gas than usual.  Walking can help get rid of the air that was put into your GI tract during the procedure and reduce the bloating. If you had a lower endoscopy (such as a colonoscopy or flexible sigmoidoscopy) you may notice spotting of blood in your stool or on the toilet paper. If you underwent a bowel prep for your procedure, you may not have a normal bowel movement for a few days.  Please Note:  You might notice some irritation and congestion in your nose or some drainage.  This is from the oxygen used during your procedure.  There is no need for concern and it should clear up in a day or so.  SYMPTOMS TO REPORT IMMEDIATELY:  Following lower endoscopy (colonoscopy or flexible sigmoidoscopy):  Excessive amounts of blood in the stool  Significant tenderness or worsening of abdominal pains  Swelling of the abdomen that is new, acute  Fever of 100F or higher  For urgent or emergent issues, a gastroenterologist can be reached at any hour by calling [phone]. Do not use MyChart messaging for urgent concerns.    DIET:  We do recommend a small meal at first, but then you may proceed to your  regular diet.  Drink plenty of fluids but you should avoid alcoholic beverages for 24 hours.  ACTIVITY:  You should plan to take it easy for the rest of today and you should NOT DRIVE or use heavy machinery until tomorrow (because of the sedation medicines used during the test).    FOLLOW UP: Our staff will call the number listed on your records the next business day following your procedure.  We will call around 7:15- 8:00 am to check on you and address any questions or concerns that you may have regarding the information given to you following your procedure. If we do not reach you, we will leave a message.     If any biopsies were taken you will be contacted by phone or by letter within the next 1-3 weeks.  Please call us  at [phone] if you have not heard about the biopsies in 3 weeks.    SIGNATURES/CONFIDENTIALITY: You and/or your care partner have signed paperwork which will be entered into your electronic medical record.  These signatures attest to the fact that that the information above on your After Visit Summary has been reviewed and is understood.  Full responsibility of the confidentiality of this discharge information lies with you and/or your care-partner.

## 2024-07-12 NOTE — Op Note (Signed)
 [facility] Patient Name: Michelle Cunningham Procedure Date: 07/12/2024 9:03 AM MRN: [id number] Endoscopist: Gustav ALONSO Mcgee , MD, [id number] Age: 58 Referring MD:  Date of Birth: Aug 26, 1966 Gender: Female Account #: [id number] Procedure:                Colonoscopy Indications:              High risk colon cancer surveillance: Personal                            history of adenoma (10 mm or greater in size), High                            risk colon cancer surveillance: Personal history of                            multiple (3 or more) adenomas Medicines:                Monitored Anesthesia Care Procedure:                Pre-Anesthesia Assessment:                           - Prior to the procedure, a History and Physical                            was performed, and patient medications and                            allergies were reviewed. The patient's tolerance of                            previous anesthesia was also reviewed. The risks                            and benefits of the procedure and the sedation                            options and risks were discussed with the patient.                            All questions were answered, and informed consent                            was obtained. Prior Anticoagulants: The patient has                            taken no anticoagulant or antiplatelet agents. ASA                            Grade Assessment: II - A patient with mild systemic                            disease. After reviewing the risks and benefits,  the patient was deemed in satisfactory condition to                            undergo the procedure.                           After obtaining informed consent, the colonoscope                            was passed under direct vision. Throughout the                            procedure, the patient's blood pressure, pulse, and                            oxygen saturations were  monitored continuously. The                            PCF-H190TL Slim SN [id number] was introduced through                            the anus and advanced to the the cecum, identified                            by appendiceal orifice and ileocecal valve. The                            colonoscopy was performed without difficulty. The                            patient tolerated the procedure well. The quality                            of the bowel preparation was adequate. The                            ileocecal valve, appendiceal orifice, and rectum                            were photographed. Scope In: 9:18:34 AM Scope Out: 9:34:41 AM Scope Withdrawal Time: 0 hours 11 minutes 41 seconds  Total Procedure Duration: 0 hours 16 minutes 7 seconds  Findings:                 The perianal and digital rectal examinations were                            normal.                           Two sessile polyps were found in the ascending                            colon. The polyps were 4 to 11 mm in size. These  polyps were removed with a cold snare. Resection                            and retrieval were complete.                           A 1 mm polyp was found in the descending colon. The                            polyp was sessile. The polyp was removed with a                            cold biopsy forceps. Resection and retrieval were                            complete.                           A few small-mouthed diverticula were found in the                            sigmoid colon and descending colon.                           Non-bleeding external and internal hemorrhoids were                            found during retroflexion. The hemorrhoids were                            small. Complications:            No immediate complications. Estimated Blood Loss:     Estimated blood loss was minimal. Impression:               - Two 4 to 11 mm polyps in the  ascending colon,                            removed with a cold snare. Resected and retrieved.                           - One 1 mm polyp in the descending colon, removed                            with a cold biopsy forceps. Resected and retrieved.                           - Diverticulosis in the sigmoid colon and in the                            descending colon.                           - Non-bleeding external and internal hemorrhoids. Recommendation:           -  Patient has a contact number available for                            emergencies. The signs and symptoms of potential                            delayed complications were discussed with the                            patient. Return to normal activities tomorrow.                            Written discharge instructions were provided to the                            patient.                           - Resume previous diet.                           - Continue present medications.                           - Await pathology results.                           - Repeat colonoscopy in 3 - 5 years for                            surveillance based on pathology results. Tayari Yankee V. Orlan Aversa, MD 07/12/2024 9:47:01 AM This report has been signed electronically.

## 2024-07-15 ENCOUNTER — Telehealth: Payer: Self-pay

## 2024-07-15 NOTE — Telephone Encounter (Signed)
  Follow up Call-     07/12/2024    8:26 AM  Call back number  Post procedure Call Back phone  # [phone]  Permission to leave phone message Yes     Patient questions:  Do you have a fever, pain , or abdominal swelling? No. Pain Score  0 *  Have you tolerated food without any problems? Yes.    Have you been able to return to your normal activities? Yes.    Do you have any questions about your discharge instructions: Diet   No. Medications  No. Follow up visit  No.  Do you have questions or concerns about your Care? No.  Actions: * If pain score is 4 or above: No action needed, pain <4.

## 2024-07-16 LAB — SURGICAL PATHOLOGY

## 2024-08-14 ENCOUNTER — Ambulatory Visit: Payer: Self-pay | Admitting: Gastroenterology

## 2024-10-04 ENCOUNTER — Ambulatory Visit: Payer: BC Managed Care – PPO | Admitting: Rheumatology

## 2025-10-03 ENCOUNTER — Ambulatory Visit: Payer: BC Managed Care – PPO | Admitting: Rheumatology
